# Patient Record
Sex: Female | Born: 1950 | Race: White | Hispanic: No | Marital: Married | Smoking: Former smoker
Health system: Southern US, Community
[De-identification: ages and names within clinical notes are randomized; demographics above are authoritative.]

## PROBLEM LIST (undated history)

## (undated) HISTORY — PX: OTHER SURGICAL HISTORY: SHX169

---

## 1998-03-08 ENCOUNTER — Ambulatory Visit: Admission: RE | Admit: 1998-03-08 | Discharge: 1998-03-08 | Payer: Self-pay | Admitting: Internal Medicine

## 1998-10-07 ENCOUNTER — Ambulatory Visit: Admission: RE | Admit: 1998-10-07 | Discharge: 1998-10-07 | Payer: Self-pay | Admitting: Internal Medicine

## 1999-11-30 ENCOUNTER — Other Ambulatory Visit: Admission: RE | Admit: 1999-11-30 | Discharge: 1999-11-30 | Payer: Self-pay | Admitting: Internal Medicine

## 1999-12-13 ENCOUNTER — Encounter: Payer: Self-pay | Admitting: Internal Medicine

## 1999-12-13 ENCOUNTER — Encounter: Admission: RE | Admit: 1999-12-13 | Discharge: 1999-12-13 | Payer: Self-pay | Admitting: Internal Medicine

## 1999-12-20 ENCOUNTER — Encounter: Admission: RE | Admit: 1999-12-20 | Discharge: 1999-12-20 | Payer: Self-pay | Admitting: Internal Medicine

## 1999-12-20 ENCOUNTER — Encounter: Payer: Self-pay | Admitting: Internal Medicine

## 2000-05-23 ENCOUNTER — Encounter: Admission: RE | Admit: 2000-05-23 | Discharge: 2000-05-23 | Payer: Self-pay | Admitting: Internal Medicine

## 2000-05-23 ENCOUNTER — Encounter: Payer: Self-pay | Admitting: Internal Medicine

## 2001-02-05 ENCOUNTER — Encounter: Admission: RE | Admit: 2001-02-05 | Discharge: 2001-02-05 | Payer: Self-pay | Admitting: Internal Medicine

## 2001-02-05 ENCOUNTER — Encounter: Payer: Self-pay | Admitting: Internal Medicine

## 2001-03-07 ENCOUNTER — Other Ambulatory Visit: Admission: RE | Admit: 2001-03-07 | Discharge: 2001-03-07 | Payer: Self-pay | Admitting: Internal Medicine

## 2001-03-17 ENCOUNTER — Encounter: Admission: RE | Admit: 2001-03-17 | Discharge: 2001-03-17 | Payer: Self-pay | Admitting: Internal Medicine

## 2001-03-17 ENCOUNTER — Encounter: Payer: Self-pay | Admitting: Internal Medicine

## 2002-02-10 ENCOUNTER — Encounter: Admission: RE | Admit: 2002-02-10 | Discharge: 2002-02-10 | Payer: Self-pay | Admitting: Internal Medicine

## 2002-02-10 ENCOUNTER — Encounter: Payer: Self-pay | Admitting: Internal Medicine

## 2002-02-17 ENCOUNTER — Encounter: Payer: Self-pay | Admitting: Internal Medicine

## 2002-02-17 ENCOUNTER — Encounter: Admission: RE | Admit: 2002-02-17 | Discharge: 2002-02-17 | Payer: Self-pay | Admitting: Internal Medicine

## 2002-07-30 ENCOUNTER — Other Ambulatory Visit: Admission: RE | Admit: 2002-07-30 | Discharge: 2002-07-30 | Payer: Self-pay | Admitting: Internal Medicine

## 2003-07-23 ENCOUNTER — Encounter: Admission: RE | Admit: 2003-07-23 | Discharge: 2003-07-23 | Payer: Self-pay | Admitting: Internal Medicine

## 2003-07-23 ENCOUNTER — Encounter: Payer: Self-pay | Admitting: Internal Medicine

## 2003-08-19 ENCOUNTER — Other Ambulatory Visit: Admission: RE | Admit: 2003-08-19 | Discharge: 2003-08-19 | Payer: Self-pay | Admitting: Internal Medicine

## 2003-09-09 ENCOUNTER — Encounter: Admission: RE | Admit: 2003-09-09 | Discharge: 2003-10-08 | Payer: Self-pay | Admitting: Sports Medicine

## 2003-09-10 ENCOUNTER — Encounter: Payer: Self-pay | Admitting: Sports Medicine

## 2003-09-10 ENCOUNTER — Encounter: Admission: RE | Admit: 2003-09-10 | Discharge: 2003-09-10 | Payer: Self-pay | Admitting: Sports Medicine

## 2004-11-23 ENCOUNTER — Encounter: Admission: RE | Admit: 2004-11-23 | Discharge: 2004-11-23 | Payer: Self-pay | Admitting: Internal Medicine

## 2005-09-20 ENCOUNTER — Other Ambulatory Visit: Admission: RE | Admit: 2005-09-20 | Discharge: 2005-09-20 | Payer: Self-pay | Admitting: Internal Medicine

## 2005-12-28 ENCOUNTER — Encounter: Admission: RE | Admit: 2005-12-28 | Discharge: 2005-12-28 | Payer: Self-pay | Admitting: Internal Medicine

## 2007-09-10 ENCOUNTER — Encounter: Admission: RE | Admit: 2007-09-10 | Discharge: 2007-09-10 | Payer: Self-pay | Admitting: Internal Medicine

## 2007-09-19 ENCOUNTER — Other Ambulatory Visit: Admission: RE | Admit: 2007-09-19 | Discharge: 2007-09-19 | Payer: Self-pay | Admitting: Internal Medicine

## 2008-08-30 ENCOUNTER — Ambulatory Visit: Payer: Self-pay | Admitting: Internal Medicine

## 2008-09-02 ENCOUNTER — Other Ambulatory Visit: Admission: RE | Admit: 2008-09-02 | Discharge: 2008-09-02 | Payer: Self-pay | Admitting: Internal Medicine

## 2008-09-02 ENCOUNTER — Ambulatory Visit: Payer: Self-pay | Admitting: Internal Medicine

## 2008-11-08 ENCOUNTER — Encounter: Admission: RE | Admit: 2008-11-08 | Discharge: 2008-11-08 | Payer: Self-pay | Admitting: Internal Medicine

## 2009-03-30 ENCOUNTER — Emergency Department (HOSPITAL_COMMUNITY): Admission: EM | Admit: 2009-03-30 | Discharge: 2009-03-30 | Payer: Self-pay | Admitting: Emergency Medicine

## 2009-07-08 ENCOUNTER — Ambulatory Visit: Payer: Self-pay | Admitting: Internal Medicine

## 2009-07-18 ENCOUNTER — Ambulatory Visit: Payer: Self-pay | Admitting: Internal Medicine

## 2010-04-20 ENCOUNTER — Ambulatory Visit: Payer: Self-pay | Admitting: Internal Medicine

## 2010-06-14 ENCOUNTER — Ambulatory Visit: Payer: Self-pay | Admitting: Vascular Surgery

## 2010-08-14 ENCOUNTER — Ambulatory Visit: Payer: Self-pay | Admitting: Internal Medicine

## 2010-09-19 ENCOUNTER — Ambulatory Visit: Payer: Self-pay | Admitting: Vascular Surgery

## 2010-09-22 ENCOUNTER — Ambulatory Visit: Payer: Self-pay | Admitting: Vascular Surgery

## 2010-12-16 ENCOUNTER — Encounter: Payer: Self-pay | Admitting: Internal Medicine

## 2011-04-10 NOTE — Assessment & Plan Note (Signed)
OFFICE VISIT   RAECHEL, MARCOS  DOB:  08/18/1951                                       09/19/2010  WUJWJ#:[id number]   The patient presents today for continued followup of her right leg  venous pathology.  I had seen her for  initial evaluation in July 2011.  She was fitted with thigh-high graduated compression garments and has  been extremely compliant with these since her  instruction for use of  these.  Despite this she has continued to have discomfort particularly  when sitting and standing for prolonged periods of time In the posterior  thigh where these varicosities are.  She reports that housework and leg  work are difficult due to pain over these areas as well..  She does sit  for a prolonged period of time at her occupation.  I reimaged her veins  with Sono[employer] for screening and this does show reflux in her great  saphenous vein extending into these veins as well.  I feel that she  would be an excellent candidate for ablation of her saphenous vein and  stab phlebectomy of these tributary varicosities for symptom relief.  We  will schedule this at her convenience.     Larina Earthly, M.D.  Electronically Signed   TFE/MEDQ  D:  09/19/2010  T:  09/20/2010  Job:  [id number]   cc:   Luanna Cole. Lenord Fellers, M.D.

## 2011-04-10 NOTE — Procedures (Signed)
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   EXAM:  Using color-flow imaging and pulse Doppler spectral analysis, the  right common femoral, superficial femoral, popliteal, posterior tibial,  greater and lesser saphenous veins are evaluated.  There is no evidence  suggesting deep venous insufficiency in the right lower extremity.   The right saphenofemoral junction is competent. The right GSV is  competent.   The right proximal short saphenous vein demonstrates competency.   GSV Diameter (used if found to be incompetent only)                                            Right    Left  Proximal Greater Saphenous Vein           cm       cm  Proximal-to-mid-thigh                     cm       cm  Mid thigh                                 cm       cm  Mid-distal thigh                          cm       cm  Distal thigh                              cm       cm  Knee                                      cm       cm   IMPRESSION:  1. The right greater saphenous vein is competent.  2. The right greater saphenous vein is not aneurysmal.  3. The right greater saphenous vein is not tortuous.  4. The deep venous system is competent.  5. The right lesser saphenous vein is competent.  6. A small area in the mid greater saphenous vein that has a varicose      vein coming off does display reflux in that localized area but not      above or below that area.   ___________________________________________  Larina Earthly, M.D.   NT/MEDQ  D:  06/14/2010  T:  06/14/2010  Job:  [id number]

## 2011-04-10 NOTE — Procedures (Signed)
LOWER EXTREMITY VENOUS REFLUX EXAM   INDICATION:   EXAM:  Using color-flow imaging and pulse Doppler spectral analysis, the  right common femoral, superficial femoral, popliteal, posterior tibial,  greater and lesser saphenous veins were evaluated.  There was no  evidence suggesting deep venous insufficiency in the right lower  extremity.   The right saphenofemoral junction is competent.  The right GSV is not  competent with reflux of >500 milliseconds with the caliber as described  below.   The right proximal short saphenous vein demonstrates competency.   GSV Diameter (used if found to be incompetent only)                                            Right    Left  Proximal Greater Saphenous Vein           56 cm    cm  Proximal-to-mid-thigh                     cm       cm  Mid thigh                                 54 cm    cm  Mid-distal thigh                          cm       cm  Distal thigh                              33 cm    cm  Knee                                      cm       cm   IMPRESSION:  The right greater saphenous vein is not competent with  reflux of >500 milliseconds.  Please note the lateral branch in the  proximal area out of the inguinal canal is incompetent.  The medial  branch is incompetent with a focal area within the mid thigh.  The right  greater saphenous vein is not tortuous.  The deep venous system is  competent.  The right short saphenous vein is competent.  There is  reflux within the right greater saphenous vein at the lateral branch in  proximal thigh out of the inguinal canal.  There is reflux within the  right greater saphenous medial branch mid thigh in a focal area.  The  measurements for the right greater saphenous vein the lateral branch  proximal is 0.43 cm, mid is 0.33 cm and distal is 0.27 cm.    ___________________________________________  Larina Earthly, M.D.   OD/MEDQ  D:  09/25/2010  T:  09/25/2010  Job:   [id number]

## 2011-04-10 NOTE — Consult Note (Signed)
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  These  have been present for many years and she did suffer clot in these a  number of years ago.  At that time, she was treated with heparin and bed  rest.  These do cause discomfort with her with prolonged standing and  sitting.  She elevates her legs when possible.  She does not have any  significant swelling in her ankle and does not have any left leg  varicosities.   PAST MEDICAL HISTORY:  Unremarkable.  She is on no medications and has  no major medical difficulties such as hypertension or cardiac disease.   FAMILY HISTORY:  Negative for premature atherosclerotic disease.   SOCIAL HISTORY:  She is married with 2 children.  She works as a Radio producer.  She does not smoke having quit 20 years ago.  She does not  drink alcohol on a regular basis.   REVIEW OF SYSTEMS:  Negative for weight loss or weight gain.  Her weight  is 125 pounds.  She is 5 feet 7 inches tall.  The remainder of her  review is negative as documented in her chart and encounter form.   PHYSICAL EXAM:  Well-developed, thin, white female in no acute distress.  Blood pressure 117/59, pulse 63, respirations 16.  She is in no acute  distress.  HEENT:  Normal.  Her dorsalis pedis pulses are 2+ bilaterally.  Her radial pulses are 2+  bilaterally.  MUSCULOSKELETAL:  No major deformities or cyanosis.  NEUROLOGIC:  No focal weakness or paresthesia.  SKIN:  Without ulcers or rashes.  She does have a large plexus of varicosities in her posterior medial  right thigh.   She underwent noninvasive vascular evaluation in our office and this  showed reflux in her  great saphenous vein at the level of the  varicosities.  This does have a large branch that feeds into and then  exits out of this large nest of varicosities.  She does not have any  evidence of deep venous reflux and does not have any small saphenous  vein reflux.  I discussed options with the patient.  I explained that  this should not preclude her at any risk for more significant problems  such as DVT.  She is having discomfort associated with this.  She is  fitted for graduated compression garments, thigh-high, 20-30 mmHg today  and she is instructed on her daily use of these.  We will see her again  in 3 months for further discussion.  I do feel that she is a good  candidate for laser ablation and stab phlebectomy for relief of her  symptoms should she fail conservative treatment.     Larina Earthly, M.D.  Electronically Signed   TFE/MEDQ  D:  06/14/2010  T:  06/15/2010  Job:  [id number]   cc:   Luanna Cole. Lenord Fellers, M.D.

## 2011-09-21 ENCOUNTER — Encounter: Payer: Self-pay | Admitting: Internal Medicine

## 2011-09-21 ENCOUNTER — Ambulatory Visit (INDEPENDENT_AMBULATORY_CARE_PROVIDER_SITE_OTHER): Payer: 59 | Admitting: Internal Medicine

## 2011-09-21 VITALS — BP 96/58 | HR 76 | Temp 99.0°F | Ht 66.0 in | Wt 124.0 lb

## 2011-09-21 DIAGNOSIS — J069 Acute upper respiratory infection, unspecified: Secondary | ICD-10-CM

## 2011-09-22 ENCOUNTER — Encounter: Payer: Self-pay | Admitting: Internal Medicine

## 2011-09-22 DIAGNOSIS — M8580 Other specified disorders of bone density and structure, unspecified site: Secondary | ICD-10-CM | POA: Insufficient documentation

## 2011-09-22 NOTE — Patient Instructions (Signed)
Take Zithromax Z-PAK as directed. If not better in one week have prescription refill. Use Hycodan as needed for cough

## 2011-09-22 NOTE — Progress Notes (Signed)
  Subjective:    Patient ID: Regina Merritt, female    DOB: 09/30/1951, 60 y.o.   MRN: [id number]  HPI 60 year old white female last seen in fall 2011 in today with URI symptoms. Had shingles vaccine may 2011; influenza immunization through her employment, tetanus vaccine through employment 2003. History of bilateral tubal ligation 1989, herpes zoster right buttock  August 2010, history of osteopenia last bone density study 2008, remote history of migraine headache better since menopause, remote history of depression 1998.  No sore throat or headache. No fever or chills. Sinus congestion.    Review of Systems     Objective:   Physical Exam appears to be fatigued; pharynx no exudate; TMs clear; boggy nasal mucosa; neck is supple without significant adenopathy; chest is clear        Assessment & Plan:  URI  Plan Zithromax Z-Pak take 2 tablets by mouth day one followed by 1 tablet by mouth days 2 through 5 with 1 refill. Not better in one week have prescription refill. Hycodan 8 ounces 1 teaspoon by mouth every 6 hours when necessary cough

## 2012-03-31 ENCOUNTER — Other Ambulatory Visit: Payer: Self-pay | Admitting: Internal Medicine

## 2012-03-31 ENCOUNTER — Ambulatory Visit (INDEPENDENT_AMBULATORY_CARE_PROVIDER_SITE_OTHER): Payer: 59 | Admitting: Internal Medicine

## 2012-03-31 ENCOUNTER — Encounter: Payer: Self-pay | Admitting: Internal Medicine

## 2012-03-31 VITALS — BP 100/64 | HR 68 | Temp 98.8°F | Wt 117.0 lb

## 2012-03-31 DIAGNOSIS — M19049 Primary osteoarthritis, unspecified hand: Secondary | ICD-10-CM

## 2012-03-31 DIAGNOSIS — G5603 Carpal tunnel syndrome, bilateral upper limbs: Secondary | ICD-10-CM

## 2012-03-31 DIAGNOSIS — M1990 Unspecified osteoarthritis, unspecified site: Secondary | ICD-10-CM

## 2012-03-31 DIAGNOSIS — R202 Paresthesia of skin: Secondary | ICD-10-CM

## 2012-03-31 DIAGNOSIS — M129 Arthropathy, unspecified: Secondary | ICD-10-CM

## 2012-03-31 DIAGNOSIS — R209 Unspecified disturbances of skin sensation: Secondary | ICD-10-CM

## 2012-03-31 DIAGNOSIS — G5600 Carpal tunnel syndrome, unspecified upper limb: Secondary | ICD-10-CM

## 2012-04-01 ENCOUNTER — Encounter: Payer: Self-pay | Admitting: Internal Medicine

## 2012-04-01 NOTE — Patient Instructions (Addendum)
We are drawing blood test for hypothyroidism and rheumatoid arthritis. We will make appointment for you to see hand surgeon.

## 2012-04-01 NOTE — Progress Notes (Signed)
  Subjective:    Patient ID: Regina Merritt, female    DOB: 1951-04-15, 61 y.o.   MRN: [id number]  HPI 61 year old white female [employer] employee with numbness and weakness in both hands for several months. Symptoms are vague. Says at times has paresthesias in all 5 fingers of both hands. Says crypts are weak bilaterally. Has some pain in the palmar aspect of hands. Not dropping objects.    Review of Systems     Objective:   Physical Exam grip 4/5 bilaterally. Deep tendon reflexes 1+ and symmetrical upper extremities. Heberden's and Bouchard's nodes present bilaterally consistent with osteoarthritis of the hands        Assessment & Plan:  Possible carpal tunnel syndrome (bilateral)  Osteoarthritis of  hands  Plan: Check TSH, CCP. Refer to hand surgeon for further evaluation.

## 2012-04-02 LAB — CYCLIC CITRUL PEPTIDE ANTIBODY, IGG
Cyclic Citrullin Peptide Ab: <2 U/mL (ref 0.0–5.0)
Cyclic Citrullin Peptide Ab: <2 U/mL (ref 0.0–5.0)

## 2012-04-07 LAB — OTHER SOLSTAS TEST

## 2012-08-08 ENCOUNTER — Encounter: Payer: Self-pay | Admitting: Internal Medicine

## 2012-08-08 ENCOUNTER — Ambulatory Visit (INDEPENDENT_AMBULATORY_CARE_PROVIDER_SITE_OTHER): Payer: 59 | Admitting: Internal Medicine

## 2012-08-08 VITALS — BP 130/76 | HR 64 | Temp 98.7°F | Wt 121.5 lb

## 2012-08-08 DIAGNOSIS — G4700 Insomnia, unspecified: Secondary | ICD-10-CM

## 2012-08-08 DIAGNOSIS — F430 Acute stress reaction: Secondary | ICD-10-CM

## 2012-08-08 DIAGNOSIS — F439 Reaction to severe stress, unspecified: Secondary | ICD-10-CM

## 2012-08-24 ENCOUNTER — Encounter: Payer: Self-pay | Admitting: Internal Medicine

## 2012-08-24 DIAGNOSIS — G4700 Insomnia, unspecified: Secondary | ICD-10-CM | POA: Insufficient documentation

## 2012-08-24 NOTE — Patient Instructions (Addendum)
Take antianxiety medication one hour before going to sleep return as needed

## 2012-08-24 NOTE — Progress Notes (Signed)
  Subjective:    Patient ID: Regina Merritt, female    DOB: 09-22-1951, 61 y.o.   MRN: [id number]  HPI 61 year old white female with issues with insomnia recently. A lot of issues going on with work. Family issues with elderly mother. Not sleeping through the night. Denies being depressed. Has a lot on her mind. Needs to feel better rested. Remote history of migraine headaches but these do not seem to be an issue status post menopause.    Review of Systems     Objective:   Physical Exam skin warm and dry; no thyromegaly; chest clear to auscultation; cardiac exam regular rate and rhythm normal S1 and S2, extremities without edema         Assessment & Plan:  Insomnia  Situational stress  Plan: Xanax 0.5 mg at bedtime when necessary sleep

## 2012-10-02 ENCOUNTER — Encounter: Payer: Self-pay | Admitting: Internal Medicine

## 2012-10-02 ENCOUNTER — Ambulatory Visit (INDEPENDENT_AMBULATORY_CARE_PROVIDER_SITE_OTHER): Payer: 59 | Admitting: Internal Medicine

## 2012-10-02 ENCOUNTER — Other Ambulatory Visit: Payer: Self-pay | Admitting: Internal Medicine

## 2012-10-02 VITALS — BP 126/76 | HR 68 | Temp 98.0°F | Wt 117.5 lb

## 2012-10-02 DIAGNOSIS — M79641 Pain in right hand: Secondary | ICD-10-CM

## 2012-10-02 DIAGNOSIS — M19049 Primary osteoarthritis, unspecified hand: Secondary | ICD-10-CM

## 2012-10-02 DIAGNOSIS — M2550 Pain in unspecified joint: Secondary | ICD-10-CM

## 2012-10-02 DIAGNOSIS — M79609 Pain in unspecified limb: Secondary | ICD-10-CM

## 2012-10-02 LAB — URIC ACID: Uric Acid, Serum: 5.1 mg/dL (ref 2.4–7.0)

## 2012-10-02 LAB — SEDIMENTATION RATE: Sed Rate: 1 mm/hr (ref 0–22)

## 2012-10-02 NOTE — Progress Notes (Signed)
  Subjective:    Patient ID: Regina Merritt, female    DOB: 07-05-1951, 61 y.o.   MRN: [id number]  HPI Was seen in May for bilateral hand pain and numbness. TSH and CCP were within normal limits. [facility] Dr. Teressa Senter evaluated her. She had electrodiagnostic studies with no evidence of median or ulnar nerve abnormalities she had complained of morning stiffness. He was concerned she could have an inflammatory arthropathy. She did not have x-rays of the hands at that time. She also complained of some decreased strength in her hands when opening jars. She also had some numbness in her hands at night. Still complaining of morning stiffness.    Review of Systems     Objective:   Physical Exam Heberden's and Bouchard's nodes are present bilaterally. No erythema of finger joints bilaterally. Wrist joints slightly tender bilaterally. Good range of motion with these joints.        Assessment & Plan:  Despite negative CCP, still concerned about possible rheumatoid arthritis. Nerve conduction studies were negative in May 2013. Will refer patient to rheumatologist. ANA has been drawn   today. TSH, CCP were normal in May.  Diagnosis: Hand arthropathy bilateral

## 2012-10-03 LAB — ANTI-NUCLEAR AB-TITER (ANA TITER): ANA Titer 1: 1:1280 {titer} — ABNORMAL HIGH

## 2012-10-03 LAB — ANA: Anti Nuclear Antibody(ANA): POSITIVE — AB

## 2012-10-07 LAB — ANTI-SMITH ANTIBODY: ENA SM Ab Ser-aCnc: 3 AU/mL (ref <30)

## 2012-10-09 ENCOUNTER — Telehealth: Payer: Self-pay

## 2012-10-09 NOTE — Telephone Encounter (Signed)
Patient scheduled for an appointment with Dr. Ewell Poe on 10/15/2012 at 1:00 pm. She has been notified of this appointment

## 2012-10-16 DIAGNOSIS — M79641 Pain in right hand: Secondary | ICD-10-CM | POA: Insufficient documentation

## 2012-10-16 NOTE — Patient Instructions (Addendum)
We are checking some additional rheumatology studies including ANA. Will refer to rheumatologist.

## 2013-01-16 ENCOUNTER — Encounter: Payer: Self-pay | Admitting: Internal Medicine

## 2013-01-16 ENCOUNTER — Ambulatory Visit (INDEPENDENT_AMBULATORY_CARE_PROVIDER_SITE_OTHER): Payer: 59 | Admitting: Internal Medicine

## 2013-01-16 VITALS — BP 96/66 | Temp 99.9°F | Wt 119.0 lb

## 2013-01-16 DIAGNOSIS — R509 Fever, unspecified: Secondary | ICD-10-CM

## 2013-01-16 DIAGNOSIS — J029 Acute pharyngitis, unspecified: Secondary | ICD-10-CM

## 2013-01-16 LAB — CBC WITH DIFFERENTIAL/PLATELET
Basophils Relative: 0 % (ref 0–1)
Eosinophils Absolute: 0 10*3/uL (ref 0.0–0.7)
HCT: 37 % (ref 36.0–46.0)
Hemoglobin: 12.7 g/dL (ref 12.0–15.0)
Lymphocytes Relative: 7 % — ABNORMAL LOW (ref 12–46)
Lymphs Abs: 0.6 10*3/uL — ABNORMAL LOW (ref 0.7–4.0)
MCH: 29.5 pg (ref 26.0–34.0)
MCHC: 34.3 g/dL (ref 30.0–36.0)
MCV: 85.8 fL (ref 78.0–100.0)
Monocytes Absolute: 1 10*3/uL (ref 0.1–1.0)
Monocytes Relative: 11 % (ref 3–12)
Neutro Abs: 7.2 10*3/uL (ref 1.7–7.7)
Neutrophils Relative %: 82 % — ABNORMAL HIGH (ref 43–77)
RBC: 4.31 MIL/uL (ref 3.87–5.11)
RDW: 14.7 % (ref 11.5–15.5)

## 2013-01-16 LAB — POCT RAPID STREP A (OFFICE): Rapid Strep A Screen: NEGATIVE

## 2013-01-16 NOTE — Patient Instructions (Addendum)
Take Tylenol as needed for fever and myalgias. Start Tamiflu 75 mg twice daily for 5 days. CBC and nasal swab for influenza pending

## 2013-01-16 NOTE — Progress Notes (Signed)
  Subjective:    Patient ID: DEYANNA MCTIER, female    DOB: [birth year], 62 y.o.   MRN: [id number]  HPI 62 year old white female in today with multiple symptoms including nausea. She vomited once. Had one episode of diarrhea. Had onset last night of runny nose, chills, myalgias and malaise. Feels weak. Has had photophobia. Sore throat. No ear pain. No significant cough. Onset was abrupt last evening. No discolored nasal drainage or sputum production. No urinary tract infection symptoms.    Review of Systems     Objective:   Physical Exam HEENT exam: Pharynx is red. Rapid strep screen is negative. TMs are dull but not red. Anterior cervical nodes bilaterally. Neck is supple. Chest is clear.        Assessment & Plan:  Viral syndrome-possible influenza  Plan: Patient declined offer for nausea medication. Try Tamiflu 75 mg twice daily for 5 days. Nasal influenza swab taken. CBC with differential drawn. Tylenol as needed for fever and myalgias.

## 2013-01-17 LAB — INFLUENZA A AND B
Inflenza A Ag: NEGATIVE
Influenza B Ag: NEGATIVE

## 2013-09-08 ENCOUNTER — Ambulatory Visit (INDEPENDENT_AMBULATORY_CARE_PROVIDER_SITE_OTHER): Payer: 59 | Admitting: Internal Medicine

## 2013-09-08 ENCOUNTER — Encounter: Payer: Self-pay | Admitting: Internal Medicine

## 2013-09-08 VITALS — BP 124/68 | Temp 100.7°F | Wt 117.0 lb

## 2013-09-08 DIAGNOSIS — J209 Acute bronchitis, unspecified: Secondary | ICD-10-CM

## 2013-09-08 DIAGNOSIS — J029 Acute pharyngitis, unspecified: Secondary | ICD-10-CM

## 2013-09-08 DIAGNOSIS — J040 Acute laryngitis: Secondary | ICD-10-CM

## 2013-09-08 LAB — POCT RAPID STREP A (OFFICE): Rapid Strep A Screen: NEGATIVE

## 2013-09-08 MED ORDER — LEVOFLOXACIN 500 MG PO TABS: 500.0000 mg | ORAL_TABLET | Freq: Every day | ORAL | Status: DC

## 2013-09-08 MED ORDER — HYDROCODONE-HOMATROPINE 5-1.5 MG/5ML PO SYRP: 5.0000 mL | ORAL_SOLUTION | Freq: Three times a day (TID) | ORAL | Status: DC | PRN

## 2013-09-08 NOTE — Progress Notes (Signed)
  Subjective:    Patient ID: Regina Merritt, female    DOB: 1951-06-08, 62 y.o.   MRN: [id number]  HPI Patient recently was on 12 day course of prednisone for arthritis. She came down with respiratory infection on Friday, October 10. Has had laryngitis, productive cough, no fever or shaking chills. Has had right rib cage pain. Pulse oximetry is 96% on room air. Has felt some shortness of breath but no wheezing. Is worried about pneumonia. She is hoarse.    Review of Systems     Objective:   Physical Exam  Pharynx is slightly injected. Rapid strep screen negative. TMs are clear. Neck is supple without adenopathy. Chest clear without rales or wheezing. Tender right lower anterior chest wall to palpation.        Assessment & Plan:  Laryngitis  Bronchitis  Chest wall pain  Plan: Levaquin 500 milligrams daily for 7 days with one refill. Not better in 7 days have prescription refill. Hycodan 1 teaspoon by mouth Q8 hours when necessary cough with no refill.

## 2013-09-08 NOTE — Patient Instructions (Signed)
Take levaquin x 7 days. Take Hycodan sparingly for cough.

## 2014-04-26 ENCOUNTER — Encounter: Payer: Self-pay | Admitting: Internal Medicine

## 2014-04-26 ENCOUNTER — Ambulatory Visit (INDEPENDENT_AMBULATORY_CARE_PROVIDER_SITE_OTHER): Payer: 59 | Admitting: Internal Medicine

## 2014-04-26 VITALS — BP 116/64 | HR 72 | Temp 99.1°F | Wt 119.5 lb

## 2014-04-26 DIAGNOSIS — L02419 Cutaneous abscess of limb, unspecified: Secondary | ICD-10-CM

## 2014-04-26 DIAGNOSIS — L03119 Cellulitis of unspecified part of limb: Secondary | ICD-10-CM

## 2014-04-26 DIAGNOSIS — W57XXXA Bitten or stung by nonvenomous insect and other nonvenomous arthropods, initial encounter: Secondary | ICD-10-CM

## 2014-04-26 DIAGNOSIS — L03116 Cellulitis of left lower limb: Secondary | ICD-10-CM

## 2014-04-26 DIAGNOSIS — T148 Other injury of unspecified body region: Secondary | ICD-10-CM

## 2014-04-26 MED ORDER — LEVOFLOXACIN 500 MG PO TABS: 500.0000 mg | ORAL_TABLET | Freq: Every day | ORAL | Status: DC

## 2014-04-26 MED ORDER — TRIAMCINOLONE ACETONIDE 0.1 % EX CREA: 1.0000 "application " | TOPICAL_CREAM | Freq: Three times a day (TID) | CUTANEOUS | Status: DC

## 2014-04-26 MED ORDER — HYDROXYZINE HCL 25 MG PO TABS: 25.0000 mg | ORAL_TABLET | Freq: Three times a day (TID) | ORAL | Status: DC | PRN

## 2014-04-26 NOTE — Patient Instructions (Signed)
Take Atarax  as needed for itching.  Take antibiotic as directed for cellulitis. Use Triamcinolone cream 3 times a day for itching.

## 2014-04-26 NOTE — Progress Notes (Signed)
   Subjective:    Patient ID: Regina Merritt, female    DOB: 09/26/1951, 63 y.o.   MRN: [id number]  HPI Has been keeping the dog for her son and has noticed that dog has some takes occasionally. Patient noticed 3 insect bites left hip about a week ago. One has become very red and puffy along left hip. The other 2 seemed to be in the process of healing. These are very itchy. She noticed this upon awakening. Did not see any tick or spider in the bed.    Review of Systems     Objective:   Physical Exam 5 cm area of erythema with centralized insect bite. This area is warm to touch and rib. 2 other insect bites below this one on left hip without is much erythema and no puffiness or increased warmth.       Assessment & Plan:  Cellulitis due to insect bite left hip  2 other insect bites without cellulitis  Plan: Levaquin 500 milligrams daily for 7 days. Triamcinolone cream 0.1% to use on insect bites 3 times a day. Atarax 25 mg (#60) 1 or 2 by mouth 3 times a day when necessary itching. Call if not better in 7-10 days.

## 2014-07-29 ENCOUNTER — Encounter: Payer: Self-pay | Admitting: Internal Medicine

## 2014-07-29 ENCOUNTER — Other Ambulatory Visit: Payer: Self-pay | Admitting: Internal Medicine

## 2014-07-29 ENCOUNTER — Ambulatory Visit
Admission: RE | Admit: 2014-07-29 | Discharge: 2014-07-29 | Disposition: A | Discharge status: Home / Self Care | Payer: 59 | Source: Ambulatory Visit | Attending: Internal Medicine | Admitting: Internal Medicine

## 2014-07-29 ENCOUNTER — Ambulatory Visit (INDEPENDENT_AMBULATORY_CARE_PROVIDER_SITE_OTHER): Payer: 59 | Admitting: Internal Medicine

## 2014-07-29 VITALS — BP 98/60 | HR 66 | Ht 67.0 in | Wt 119.0 lb

## 2014-07-29 DIAGNOSIS — Z0000 Encounter for general adult medical examination without abnormal findings: Secondary | ICD-10-CM

## 2014-07-29 DIAGNOSIS — R0781 Pleurodynia: Secondary | ICD-10-CM

## 2014-07-29 DIAGNOSIS — Z780 Asymptomatic menopausal state: Secondary | ICD-10-CM

## 2014-07-29 DIAGNOSIS — R079 Chest pain, unspecified: Secondary | ICD-10-CM

## 2014-07-29 NOTE — Patient Instructions (Signed)
Please get rib detail films today. Have mammogram and bone density study in the near future. We will book physical exam in the near future.

## 2014-07-29 NOTE — Progress Notes (Signed)
   Subjective:    Patient ID: Regina Merritt, female    DOB: 01/29/1951, 63 y.o.   MRN: [id number]  HPI  Patient has been keeping a 40 pound dog for her son. She had the dog out for walk the other night on a leash and unfortunately she fell down as the dog was pulling her with  leash. This was about a week ago. She struck her right rib cage area. Was not able to breathe well for several days. Beginning to feel better now with less pain with breathing.    Review of Systems     Objective:   Physical Exam  She has right rib tenderness and axillary line on the right. Seems to be a superior rehabilitation that is tender. Chest is clear to auscultation. No friction rub.      Assessment & Plan:  Right rib injury rule out fracture  Addendum: Rib films are negative for fracture  Plan: Patient declines offer for pain medication. Will receive influenza vaccine through employment. Return as needed. Have bone density study and mammogram in the near future.

## 2014-07-29 NOTE — Progress Notes (Signed)
Patient informed. 

## 2014-07-30 NOTE — Addendum Note (Signed)
Addended by: Amado Coe on: 07/30/2014 12:01 PM   Modules accepted: Orders

## 2014-09-02 ENCOUNTER — Ambulatory Visit
Admission: RE | Admit: 2014-09-02 | Discharge: 2014-09-02 | Disposition: A | Discharge status: Home / Self Care | Payer: 59 | Source: Ambulatory Visit | Attending: Internal Medicine | Admitting: Internal Medicine

## 2014-09-02 ENCOUNTER — Other Ambulatory Visit: Payer: 59

## 2014-09-02 ENCOUNTER — Telehealth: Payer: Self-pay

## 2014-09-02 DIAGNOSIS — Z0000 Encounter for general adult medical examination without abnormal findings: Secondary | ICD-10-CM

## 2014-09-02 DIAGNOSIS — Z780 Asymptomatic menopausal state: Secondary | ICD-10-CM

## 2014-09-02 NOTE — Telephone Encounter (Signed)
Appointment scheduled to discuss bone density results.

## 2014-09-02 NOTE — Telephone Encounter (Signed)
Message copied by Amado Coe on Thu Sep 02, 2014  4:48 PM ------      Message from: Elby Showers      Created: Thu Sep 02, 2014  3:59 PM       OV to discuss bone density results ------

## 2014-09-10 ENCOUNTER — Other Ambulatory Visit: Payer: Self-pay

## 2014-09-13 ENCOUNTER — Other Ambulatory Visit (HOSPITAL_COMMUNITY)
Admission: RE | Admit: 2014-09-13 | Discharge: 2014-09-13 | Disposition: A | Discharge status: Home / Self Care | Payer: 59 | Source: Ambulatory Visit | Attending: Internal Medicine | Admitting: Internal Medicine

## 2014-09-13 ENCOUNTER — Ambulatory Visit (INDEPENDENT_AMBULATORY_CARE_PROVIDER_SITE_OTHER): Payer: 59 | Admitting: Internal Medicine

## 2014-09-13 ENCOUNTER — Encounter: Payer: Self-pay | Admitting: Internal Medicine

## 2014-09-13 VITALS — BP 110/70 | HR 62 | Temp 97.2°F | Ht 67.0 in | Wt 120.0 lb

## 2014-09-13 DIAGNOSIS — Z23 Encounter for immunization: Secondary | ICD-10-CM | POA: Diagnosis not present

## 2014-09-13 DIAGNOSIS — M069 Rheumatoid arthritis, unspecified: Secondary | ICD-10-CM | POA: Diagnosis not present

## 2014-09-13 DIAGNOSIS — M8580 Other specified disorders of bone density and structure, unspecified site: Secondary | ICD-10-CM | POA: Diagnosis not present

## 2014-09-13 DIAGNOSIS — Z01419 Encounter for gynecological examination (general) (routine) without abnormal findings: Secondary | ICD-10-CM | POA: Diagnosis present

## 2014-09-13 DIAGNOSIS — D72819 Decreased white blood cell count, unspecified: Secondary | ICD-10-CM

## 2014-09-13 DIAGNOSIS — Z0000 Encounter for general adult medical examination without abnormal findings: Secondary | ICD-10-CM

## 2014-09-13 DIAGNOSIS — E559 Vitamin D deficiency, unspecified: Secondary | ICD-10-CM

## 2014-09-13 LAB — TSH: TSH: 1.153 u[IU]/mL (ref 0.350–4.500)

## 2014-09-13 LAB — CBC WITH DIFFERENTIAL/PLATELET
BASOS PCT: 0 % (ref 0–1)
Basophils Absolute: 0 10*3/uL (ref 0.0–0.1)
Eosinophils Absolute: 0.1 10*3/uL (ref 0.0–0.7)
Eosinophils Relative: 2 % (ref 0–5)
HEMATOCRIT: 36 % (ref 36.0–46.0)
HEMOGLOBIN: 12.2 g/dL (ref 12.0–15.0)
LYMPHS ABS: 0.8 10*3/uL (ref 0.7–4.0)
Lymphocytes Relative: 22 % (ref 12–46)
MCH: 30.3 pg (ref 26.0–34.0)
MCHC: 33.9 g/dL (ref 30.0–36.0)
MCV: 89.3 fL (ref 78.0–100.0)
MONO ABS: 0.4 10*3/uL (ref 0.1–1.0)
MONOS PCT: 11 % (ref 3–12)
NEUTROS PCT: 65 % (ref 43–77)
Neutro Abs: 2.3 10*3/uL (ref 1.7–7.7)
Platelets: 215 10*3/uL (ref 150–400)
RBC: 4.03 MIL/uL (ref 3.87–5.11)
RDW: 14.1 % (ref 11.5–15.5)
WBC: 3.5 10*3/uL — ABNORMAL LOW (ref 4.0–10.5)

## 2014-09-13 LAB — COMPREHENSIVE METABOLIC PANEL
ALT: 14 U/L (ref 0–35)
AST: 19 U/L (ref 0–37)
Albumin: 4.1 g/dL (ref 3.5–5.2)
Alkaline Phosphatase: 50 U/L (ref 39–117)
BILIRUBIN TOTAL: 0.6 mg/dL (ref 0.2–1.2)
BUN: 19 mg/dL (ref 6–23)
CO2: 26 meq/L (ref 19–32)
Calcium: 9 mg/dL (ref 8.4–10.5)
Chloride: 106 mEq/L (ref 96–112)
Creat: 0.97 mg/dL (ref 0.50–1.10)
GLUCOSE: 88 mg/dL (ref 70–99)
Potassium: 4 mEq/L (ref 3.5–5.3)
SODIUM: 141 meq/L (ref 135–145)
TOTAL PROTEIN: 6.3 g/dL (ref 6.0–8.3)

## 2014-09-13 LAB — LIPID PANEL
CHOLESTEROL: 158 mg/dL (ref 0–200)
HDL: 65 mg/dL (ref >39)
LDL Cholesterol: 84 mg/dL (ref 0–99)
TRIGLYCERIDES: 46 mg/dL (ref <150)
Total CHOL/HDL Ratio: 2.4 Ratio
VLDL: 9 mg/dL (ref 0–40)

## 2014-09-13 MED ORDER — ALENDRONATE SODIUM 70 MG PO TABS: 70.0000 mg | ORAL_TABLET | ORAL | Status: DC

## 2014-09-13 NOTE — Patient Instructions (Addendum)
Start Fosamx 70 mg weekly. RTC in one year. Take 2000 units vitamin D 3 daily.

## 2014-09-14 LAB — VITAMIN D 25 HYDROXY (VIT D DEFICIENCY, FRACTURES): Vit D, 25-Hydroxy: 24 ng/mL — ABNORMAL LOW (ref 30–89)

## 2014-09-15 LAB — CYTOLOGY - PAP

## 2014-11-28 NOTE — Progress Notes (Signed)
   Subjective:    Patient ID: Regina Merritt, female    DOB: [birth year], 64 y.o.   MRN: [id number]  HPI  64 year old White Female in today for health maintenance exam. Remote history of migraine headaches. Doesn't really get these anymore since she became menopausal. History of hand arthritis diagnosed by rheumatologist, Dr. Ouida Sills, in 2013. She is on methotrexate. She had a positive ANA with a speckled pattern and a negative CCP.  Past medical history: She is allergic to penicillin it causes hives. Intolerant of Keflex as well. History of osteopenia. Came menopausal around 2005. 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 Had depression in 1998. 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 Had bilateral tubal ligation 1989. Zostavax vaccine given in 2011. Took Actonel for a while for osteopenia. Recent bone density study earlier this month showed right femoral neck -2.2 and LS-spine -1.4. In 2005 she had a bone density study showing a T score of -1.8 in the femoral neck and -0.5 in the LS-spine. Saw Dr. early in 2011 for varicosities in right leg. Was treated with compression stockings and was thought to be a good candidate for laser ablation and stab phlebectomy. History of onychomycosis treated with Sporanox 1999.  Social history: She's been married twice. Has 2 children. Nonsmoker. No alcohol consumption. Employed as a Environmental education officer at [employer].  Family history:  One brother alive and well. Father with history of hypertension.    Review of Systems  Constitutional: Negative.   All other systems reviewed and are negative.      Objective:   Physical Exam  Constitutional: She is oriented to person, place, and time. She appears well-developed and well-nourished. No distress.  HENT:  Head: Normocephalic and atraumatic.  Right Ear: External ear normal.  Left Ear: External ear normal.  Mouth/Throat: Oropharynx is clear and moist. No oropharyngeal exudate.  Eyes: Conjunctivae are  normal. Pupils are equal, round, and reactive to light. Right eye exhibits no discharge. Left eye exhibits no discharge. No scleral icterus.  Neck: Neck supple. No JVD present. No thyromegaly present.  Cardiovascular: Normal rate, regular rhythm, normal heart sounds and intact distal pulses.   No murmur heard. Pulmonary/Chest: Effort normal and breath sounds normal. No respiratory distress. She has no wheezes. She has no rales.  Breasts normal female  Abdominal: Soft. Bowel sounds are normal. She exhibits no distension and no mass. There is no tenderness. There is no rebound and no guarding.  Genitourinary:  Pap taken  Musculoskeletal: She exhibits no edema.  Lymphadenopathy:    She has no cervical adenopathy.  Neurological: She is alert and oriented to person, place, and time. No cranial nerve deficit. Coordination normal.  Skin: Skin is warm and dry. No rash noted. She is not diaphoretic.  Psychiatric: She has a normal mood and affect. Her behavior is normal. Judgment and thought content normal.  Vitals reviewed.         Assessment & Plan:   Normal health maintenance exam  Vitamin D deficiency   Hand arthritis treated with methotrexate by rheumatologist  Osteopenia-begin Fosamax weekly.  Plan: 2000 units vitamin D 3 daily. Return in one year. White blood cell count is slightly low at 3500. She is on methotrexate and is monitored by rheumatologist.  Needs annual mammogram. Has declined colonoscopy in the past.

## 2014-11-29 ENCOUNTER — Telehealth: Payer: Self-pay | Admitting: *Deleted

## 2014-11-29 NOTE — Telephone Encounter (Signed)
Called Dr Sharyon Cable office requested last office visit note be faxed over for review by Dr Renold Genta spoke with Lattie Haw in Medical records she will fax over last note

## 2015-01-14 ENCOUNTER — Encounter: Payer: Self-pay | Admitting: Internal Medicine

## 2015-01-14 ENCOUNTER — Ambulatory Visit (INDEPENDENT_AMBULATORY_CARE_PROVIDER_SITE_OTHER): Payer: 59 | Admitting: Internal Medicine

## 2015-01-14 VITALS — BP 104/64 | HR 104 | Temp 99.3°F | Wt 123.0 lb

## 2015-01-14 DIAGNOSIS — J029 Acute pharyngitis, unspecified: Secondary | ICD-10-CM

## 2015-01-14 DIAGNOSIS — M069 Rheumatoid arthritis, unspecified: Secondary | ICD-10-CM

## 2015-01-14 LAB — POCT RAPID STREP A (OFFICE): RAPID STREP A SCREEN: NEGATIVE

## 2015-01-14 MED ORDER — LEVOFLOXACIN 500 MG PO TABS: 500.0000 mg | ORAL_TABLET | Freq: Every day | ORAL | Status: DC

## 2015-01-14 MED ORDER — HYDROCODONE-HOMATROPINE 5-1.5 MG/5ML PO SYRP: 5.0000 mL | ORAL_SOLUTION | Freq: Three times a day (TID) | ORAL | Status: DC | PRN

## 2015-01-14 NOTE — Progress Notes (Signed)
   Subjective:    Patient ID: Regina Merritt, female    DOB: 02-11-1951, 64 y.o.   MRN: [id number]  HPI  Onset Sunday of URI symptoms with headache and sore throat. Some cough. No shaking chills.    Review of Systems     Objective:   Physical Exam  Rapid strep screen is negative. TMs are slightly full. Pharynx is red without exudate. Neck supple. Chest clear to auscultation without rales or wheezing.      Assessment & Plan:  Headache  Acute upper respiratory infection  Plan: Levaquin 500 milligrams daily for 7 days. Hycodan 1 teaspoon by mouth every 6 hours when necessary cough or headache. Call if not better in 48 hours or sooner if worse. May also take Tylenol for pain.

## 2015-01-19 DIAGNOSIS — M069 Rheumatoid arthritis, unspecified: Secondary | ICD-10-CM | POA: Insufficient documentation

## 2015-01-19 NOTE — Patient Instructions (Signed)
Take Levaquin as directed and Hycodan as directed for cough and headache. Call if not better in 48 hours

## 2015-01-21 ENCOUNTER — Telehealth: Payer: Self-pay | Admitting: Internal Medicine

## 2015-01-21 NOTE — Telephone Encounter (Signed)
Was in last week to see you and she has 2 pills left to take (Levaquin).  The cough syrup has helped, but she still has a LOT of congestion left.  Still running some fever.  She is taking tylenol and it gets rid of the fever but it still seems to creep back up.    Do you want her to come in to the office?

## 2015-01-21 NOTE — Telephone Encounter (Signed)
See Monday if no better.

## 2015-01-21 NOTE — Telephone Encounter (Signed)
Spoke with patient instructed her to complete current antibiotic and to call us back on Monday if no better

## 2015-02-01 ENCOUNTER — Ambulatory Visit
Admission: RE | Admit: 2015-02-01 | Discharge: 2015-02-01 | Disposition: A | Discharge status: Home / Self Care | Payer: 59 | Source: Ambulatory Visit | Attending: Internal Medicine | Admitting: Internal Medicine

## 2015-02-01 ENCOUNTER — Ambulatory Visit (INDEPENDENT_AMBULATORY_CARE_PROVIDER_SITE_OTHER): Payer: 59 | Admitting: Internal Medicine

## 2015-02-01 ENCOUNTER — Encounter: Payer: Self-pay | Admitting: Internal Medicine

## 2015-02-01 VITALS — BP 98/62 | HR 73 | Temp 97.9°F | Wt 120.0 lb

## 2015-02-01 DIAGNOSIS — R059 Cough, unspecified: Secondary | ICD-10-CM

## 2015-02-01 DIAGNOSIS — R05 Cough: Secondary | ICD-10-CM

## 2015-02-01 MED ORDER — PREDNISONE 10 MG PO TABS: ORAL_TABLET | ORAL | Status: DC

## 2015-02-01 MED ORDER — CLARITHROMYCIN 500 MG PO TABS: 500.0000 mg | ORAL_TABLET | Freq: Two times a day (BID) | ORAL | Status: DC

## 2015-02-07 NOTE — Patient Instructions (Signed)
Take prednisone in tapering course as directed. Take Biaxin 500 mg twice daily for 10 days.

## 2015-02-07 NOTE — Progress Notes (Signed)
   Subjective:    Patient ID: Regina Merritt, female    DOB: 01/02/1951, 64 y.o.   MRN: [id number]  HPI Patient was seen February 19 for respiratory infection. Was treated with a seven-day course of Levaquin and Hycodan. Has persistent cough and congestion. Main complaint is cough. No fever or shaking chills. At last visit rapid strep screen was negative. She is on methotrexate for rheumatoid arthritis.    Review of Systems     Objective:   Physical Exam Skin warm and dry. Nodes none. TMs and pharynx are clear. Neck supple. Chest clear to auscultation without rales or wheezing. Chest x-ray no pneumonia.      Assessment & Plan:  Protracted upper respiratory infection  Plan: Sterapred DS 10 mg 6 day dosepak. Biaxin 500 mg twice daily for 10 days.

## 2015-10-17 ENCOUNTER — Telehealth: Payer: Self-pay | Admitting: Internal Medicine

## 2015-10-17 NOTE — Telephone Encounter (Signed)
Pt called, Dr. Ouida Sills from [facility] has been seeing pt, but he has retired.  She would like to be referred to another rheumatologist at another practice if possible.  Best number to call pt is [phone]

## 2015-10-17 NOTE — Telephone Encounter (Signed)
Pt may call Dr. Gavin Pound at [phone] and say she is former pt of Dr. Tonette Bihari. Dr. Trudie Reed practiced with Dr. Ouida Sills but she and another Rheumatologist left that office when he did and are located at [address], [place]

## 2015-10-17 NOTE — Telephone Encounter (Signed)
Spoke with pt, she will call Dr. Tonette Bihari office to get records transferred to Dr. Trudie Reed office.  They are expecting her call to schedule.

## 2015-12-09 MED FILL — FOLIC ACID 1 MG TABLET: 1 | 90 days supply | Qty: 90 | Fill #2

## 2015-12-16 MED FILL — METHOTREXATE 25 MG/ML VIAL: 50 | 84 days supply | Qty: 6 | Fill #2

## 2016-01-25 DIAGNOSIS — M0609 Rheumatoid arthritis without rheumatoid factor, multiple sites: Secondary | ICD-10-CM | POA: Diagnosis not present

## 2016-01-25 DIAGNOSIS — Z79899 Other long term (current) drug therapy: Secondary | ICD-10-CM | POA: Diagnosis not present

## 2016-01-25 DIAGNOSIS — M79642 Pain in left hand: Secondary | ICD-10-CM | POA: Diagnosis not present

## 2016-01-25 DIAGNOSIS — M79641 Pain in right hand: Secondary | ICD-10-CM | POA: Diagnosis not present

## 2016-01-25 DIAGNOSIS — M79671 Pain in right foot: Secondary | ICD-10-CM | POA: Diagnosis not present

## 2016-01-25 DIAGNOSIS — M79672 Pain in left foot: Secondary | ICD-10-CM | POA: Diagnosis not present

## 2016-01-25 MED FILL — FOLIC ACID 1 MG TABLET: 1 | 30 days supply | Qty: 60 | Fill #0

## 2016-03-09 MED FILL — METHOTREXATE 25 MG/ML VIAL: 50 | 84 days supply | Qty: 6 | Fill #3

## 2016-04-26 ENCOUNTER — Encounter: Payer: Self-pay | Admitting: Internal Medicine

## 2016-04-26 ENCOUNTER — Ambulatory Visit (INDEPENDENT_AMBULATORY_CARE_PROVIDER_SITE_OTHER): Payer: 59 | Admitting: Internal Medicine

## 2016-04-26 VITALS — BP 114/68 | HR 70 | Temp 97.5°F | Resp 18 | Wt 120.0 lb

## 2016-04-26 DIAGNOSIS — B880 Other acariasis: Secondary | ICD-10-CM | POA: Diagnosis not present

## 2016-04-26 DIAGNOSIS — M79671 Pain in right foot: Secondary | ICD-10-CM | POA: Diagnosis not present

## 2016-04-26 DIAGNOSIS — Z79899 Other long term (current) drug therapy: Secondary | ICD-10-CM | POA: Diagnosis not present

## 2016-04-26 DIAGNOSIS — M79672 Pain in left foot: Secondary | ICD-10-CM | POA: Diagnosis not present

## 2016-04-26 DIAGNOSIS — M0609 Rheumatoid arthritis without rheumatoid factor, multiple sites: Secondary | ICD-10-CM | POA: Diagnosis not present

## 2016-04-26 MED ORDER — TRIAMCINOLONE ACETONIDE 0.1 % EX CREA: 1.0000 "application " | TOPICAL_CREAM | Freq: Three times a day (TID) | CUTANEOUS | Status: DC

## 2016-04-26 MED FILL — TRIAMCINOLONE 0.1% CREAM: 0.1 | 15 days supply | Qty: 45 | Fill #0

## 2016-04-26 NOTE — Progress Notes (Signed)
   Subjective:    Patient ID: Regina Merritt, female    DOB: Oct 22, 1951, 66 y.o.   MRN: [id number]  HPI 65 year old Female in today with itchy rash on abdomen and lateral trunk area. Has not been doing yard work. Has been walking her dog. Can't get much relief due to itching. She does take methotrexate for rheumatoid arthritis along with folic acid.    Review of Systems see above     Objective:   Physical Exam  There are multiple red papules on her anterior abdomen and left lateral area. One discrete papule right axillary line. These papules have a central punctum consistent with bites.      Assessment & Plan:   Chigger bites  Plan: She has already applied clear nail polish to the bite areas. Have prescribed triamcinolone cream to use 3 times daily. May take Benadryl for itching as well.

## 2016-04-26 NOTE — Patient Instructions (Addendum)
Triamcinolone cream 0.1% to bites 3 times daily. May take Benadryl. Clear nail polish to bites.

## 2016-06-04 MED FILL — METHOTREXATE 25 MG/ML VIAL: 50 | 84 days supply | Qty: 6 | Fill #0

## 2016-08-01 DIAGNOSIS — M0609 Rheumatoid arthritis without rheumatoid factor, multiple sites: Secondary | ICD-10-CM | POA: Diagnosis not present

## 2016-08-01 DIAGNOSIS — Z79899 Other long term (current) drug therapy: Secondary | ICD-10-CM | POA: Diagnosis not present

## 2016-08-01 MED FILL — FOLIC ACID 1 MG TABLET: 1 | 30 days supply | Qty: 60 | Fill #0

## 2016-08-23 MED FILL — METHOTREXATE 25 MG/ML VIAL: 50 | 84 days supply | Qty: 6 | Fill #1

## 2016-09-04 MED FILL — FOLIC ACID 1 MG TABLET: 1 | 30 days supply | Qty: 60 | Fill #1 | Status: TO

## 2016-10-04 ENCOUNTER — Ambulatory Visit (INDEPENDENT_AMBULATORY_CARE_PROVIDER_SITE_OTHER): Payer: Medicare Other | Admitting: Internal Medicine

## 2016-10-04 DIAGNOSIS — Z23 Encounter for immunization: Secondary | ICD-10-CM

## 2016-11-05 DIAGNOSIS — Z79899 Other long term (current) drug therapy: Secondary | ICD-10-CM | POA: Diagnosis not present

## 2016-11-05 DIAGNOSIS — M0609 Rheumatoid arthritis without rheumatoid factor, multiple sites: Secondary | ICD-10-CM | POA: Diagnosis not present

## 2016-11-05 DIAGNOSIS — R636 Underweight: Secondary | ICD-10-CM | POA: Diagnosis not present

## 2016-11-05 DIAGNOSIS — Z681 Body mass index (BMI) 19 or less, adult: Secondary | ICD-10-CM | POA: Diagnosis not present

## 2016-11-05 DIAGNOSIS — M2550 Pain in unspecified joint: Secondary | ICD-10-CM | POA: Diagnosis not present

## 2016-12-10 MED FILL — METHOTREXATE 25 MG/ML VIAL: 50 | 55 days supply | Qty: 4 | Fill #2

## 2017-02-08 DIAGNOSIS — Z681 Body mass index (BMI) 19 or less, adult: Secondary | ICD-10-CM | POA: Diagnosis not present

## 2017-02-08 DIAGNOSIS — M0609 Rheumatoid arthritis without rheumatoid factor, multiple sites: Secondary | ICD-10-CM | POA: Diagnosis not present

## 2017-02-08 DIAGNOSIS — Z79899 Other long term (current) drug therapy: Secondary | ICD-10-CM | POA: Diagnosis not present

## 2017-02-08 MED FILL — METHOTREXATE 25 MG/ML VIAL: 50 | 56 days supply | Qty: 4 | Fill #0

## 2017-03-01 MED FILL — BD TB SYRINGE 27GX1/2": 27G X 1/2" | 30 days supply | Qty: 12 | Fill #0

## 2017-03-01 MED FILL — BD TB SYRINGE 27GX1/2: 27G X 1/2" | 30 days supply | Qty: 12 | Fill #0

## 2017-04-09 MED FILL — METHOTREXATE 25 MG/ML VIAL: 50 | 56 days supply | Qty: 4 | Fill #1

## 2017-05-13 DIAGNOSIS — M2550 Pain in unspecified joint: Secondary | ICD-10-CM | POA: Diagnosis not present

## 2017-05-13 DIAGNOSIS — M0609 Rheumatoid arthritis without rheumatoid factor, multiple sites: Secondary | ICD-10-CM | POA: Diagnosis not present

## 2017-05-13 DIAGNOSIS — Z79899 Other long term (current) drug therapy: Secondary | ICD-10-CM | POA: Diagnosis not present

## 2017-05-13 DIAGNOSIS — Z681 Body mass index (BMI) 19 or less, adult: Secondary | ICD-10-CM | POA: Diagnosis not present

## 2017-05-27 MED FILL — BD TB SYRINGE 27GX1/2: 27G X 1/2" | 30 days supply | Qty: 12 | Fill #1

## 2017-05-27 MED FILL — BD TB SYRINGE 27GX1/2": 27G X 1/2" | 30 days supply | Qty: 12 | Fill #1

## 2017-08-15 MED FILL — BD TB SYRINGE 27GX1/2": 27G X 1/2" | 30 days supply | Qty: 12 | Fill #2

## 2017-08-15 MED FILL — METHOTREXATE 25 MG/ML VIAL: 50 | 56 days supply | Qty: 4 | Fill #2

## 2017-08-15 MED FILL — BD TB SYRINGE 27GX1/2: 27G X 1/2" | 30 days supply | Qty: 12 | Fill #2

## 2017-08-20 DIAGNOSIS — M2550 Pain in unspecified joint: Secondary | ICD-10-CM | POA: Diagnosis not present

## 2017-08-20 DIAGNOSIS — M0609 Rheumatoid arthritis without rheumatoid factor, multiple sites: Secondary | ICD-10-CM | POA: Diagnosis not present

## 2017-08-20 DIAGNOSIS — Z79899 Other long term (current) drug therapy: Secondary | ICD-10-CM | POA: Diagnosis not present

## 2017-08-20 DIAGNOSIS — Z681 Body mass index (BMI) 19 or less, adult: Secondary | ICD-10-CM | POA: Diagnosis not present

## 2017-09-12 MED FILL — LIDOCAINE-PRILOCAINE CREAM: 2.5-2.5 | 10 days supply | Qty: 30 | Fill #0

## 2017-11-08 MED FILL — BD TB SYRINGE 27GX1/2: 27G X 1/2" | 30 days supply | Qty: 12 | Fill #3

## 2017-11-08 MED FILL — METHOTREXATE 25 MG/ML VIAL: 50 | 56 days supply | Qty: 4 | Fill #3

## 2017-11-08 MED FILL — BD TB SYRINGE 27GX1/2": 27G X 1/2" | 30 days supply | Qty: 12 | Fill #3

## 2017-11-11 ENCOUNTER — Encounter: Payer: Self-pay | Admitting: Internal Medicine

## 2017-11-11 ENCOUNTER — Ambulatory Visit (INDEPENDENT_AMBULATORY_CARE_PROVIDER_SITE_OTHER): Payer: Medicare Other | Admitting: Internal Medicine

## 2017-11-11 VITALS — BP 120/70 | HR 58 | Temp 98.1°F | Wt 128.0 lb

## 2017-11-11 DIAGNOSIS — M7061 Trochanteric bursitis, right hip: Secondary | ICD-10-CM

## 2017-11-11 DIAGNOSIS — M545 Low back pain, unspecified: Secondary | ICD-10-CM

## 2017-11-11 DIAGNOSIS — Z8739 Personal history of other diseases of the musculoskeletal system and connective tissue: Secondary | ICD-10-CM | POA: Diagnosis not present

## 2017-11-11 NOTE — Patient Instructions (Signed)
Trial of Mobic 15 mg daily for right hip pain and back pain.  Patient is to discuss this treatment with rheumatologist tomorrow.

## 2017-11-11 NOTE — Progress Notes (Signed)
   Subjective:    Patient ID: Regina Merritt, female    DOB: [birth year], 66 y.o.   MRN: [id number]  HPI Recently noticed some pain in her right lateral hip and right posterior back.  Is helping some operate [employer] and has been doing a lot of walking around the property since before Halloween.  No injury or fall.  She is on methotrexate per Dr. Lenna Gilford for rheumatoid arthritis.  She does not like taking it.  She is to see Dr. Lenna Gilford tomorrow for discussion.    Review of Systems see above     Objective:   Physical Exam She is tender over her right greater trochanter.  Internal and external rotation of right hip is essentially normal with some mild pain right external hip rotation around the right trochanter straight leg raising is negative at 90 degrees and muscle strength is normal in the right lower extremity       Assessment & Plan:  Rheumatoid arthritis  Right trochanteric bursitis  Musculoskeletal pain right posterior superior iliac spine  Plan: I have given her prescription for Mobic 15 mg daily.  I have asked her to discuss whether or not Dr. Lenna Gilford agrees with that tomorrow when she sees her.  She may need some physical therapy for the trochanteric bursitis and right back pain.

## 2017-11-12 DIAGNOSIS — M0609 Rheumatoid arthritis without rheumatoid factor, multiple sites: Secondary | ICD-10-CM | POA: Diagnosis not present

## 2017-11-12 DIAGNOSIS — R5383 Other fatigue: Secondary | ICD-10-CM | POA: Diagnosis not present

## 2017-11-12 DIAGNOSIS — M2550 Pain in unspecified joint: Secondary | ICD-10-CM | POA: Diagnosis not present

## 2017-11-12 DIAGNOSIS — Z79899 Other long term (current) drug therapy: Secondary | ICD-10-CM | POA: Diagnosis not present

## 2017-11-12 DIAGNOSIS — Z6820 Body mass index (BMI) 20.0-20.9, adult: Secondary | ICD-10-CM | POA: Diagnosis not present

## 2017-11-14 ENCOUNTER — Ambulatory Visit: Payer: Medicare Other | Admitting: Internal Medicine

## 2017-11-14 DIAGNOSIS — Z23 Encounter for immunization: Secondary | ICD-10-CM | POA: Diagnosis not present

## 2018-09-30 DIAGNOSIS — Z23 Encounter for immunization: Secondary | ICD-10-CM | POA: Diagnosis not present

## 2019-07-29 ENCOUNTER — Other Ambulatory Visit: Payer: Self-pay

## 2019-07-29 ENCOUNTER — Other Ambulatory Visit (INDEPENDENT_AMBULATORY_CARE_PROVIDER_SITE_OTHER): Payer: Medicare Other | Admitting: Internal Medicine

## 2019-07-29 DIAGNOSIS — M79641 Pain in right hand: Secondary | ICD-10-CM | POA: Diagnosis not present

## 2019-07-29 DIAGNOSIS — G4700 Insomnia, unspecified: Secondary | ICD-10-CM

## 2019-07-29 DIAGNOSIS — M8580 Other specified disorders of bone density and structure, unspecified site: Secondary | ICD-10-CM | POA: Diagnosis not present

## 2019-07-29 DIAGNOSIS — M79642 Pain in left hand: Secondary | ICD-10-CM

## 2019-07-29 DIAGNOSIS — M069 Rheumatoid arthritis, unspecified: Secondary | ICD-10-CM

## 2019-07-29 NOTE — Progress Notes (Signed)
Labs drawn by CMA

## 2019-07-29 NOTE — Patient Instructions (Signed)
Labs drawn and pending

## 2019-07-30 ENCOUNTER — Other Ambulatory Visit: Payer: PRIVATE HEALTH INSURANCE | Admitting: Internal Medicine

## 2019-07-30 LAB — COMPLETE METABOLIC PANEL WITH GFR
AG Ratio: 1.6 (calc) (ref 1.0–2.5)
ALT: 12 U/L (ref 6–29)
AST: 14 U/L (ref 10–35)
Albumin: 4 g/dL (ref 3.6–5.1)
Alkaline phosphatase (APISO): 47 U/L (ref 37–153)
BUN/Creatinine Ratio: 14 (calc) (ref 6–22)
BUN: 14 mg/dL (ref 7–25)
CO2: 27 mmol/L (ref 20–32)
Calcium: 9.4 mg/dL (ref 8.6–10.4)
Chloride: 105 mmol/L (ref 98–110)
Creat: 1.02 mg/dL — ABNORMAL HIGH (ref 0.50–0.99)
GFR, Est African American: 66 mL/min/{1.73_m2} (ref >=60)
GFR, Est Non African American: 57 mL/min/{1.73_m2} — ABNORMAL LOW (ref >=60)
Globulin: 2.5 g/dL (calc) (ref 1.9–3.7)
Glucose, Bld: 85 mg/dL (ref 65–99)
Potassium: 4.6 mmol/L (ref 3.5–5.3)
Sodium: 140 mmol/L (ref 135–146)
Total Bilirubin: 0.6 mg/dL (ref 0.2–1.2)
Total Protein: 6.5 g/dL (ref 6.1–8.1)

## 2019-07-30 LAB — TSH: TSH: 1.99 mIU/L (ref 0.40–4.50)

## 2019-07-30 LAB — CBC WITH DIFFERENTIAL/PLATELET
Absolute Monocytes: 551 cells/uL (ref 200–950)
Basophils Absolute: 21 cells/uL (ref 0–200)
Basophils Relative: 0.4 %
Eosinophils Absolute: 109 cells/uL (ref 15–500)
Eosinophils Relative: 2.1 %
HCT: 40.2 % (ref 35.0–45.0)
Hemoglobin: 13 g/dL (ref 11.7–15.5)
Lymphs Abs: 936 cells/uL (ref 850–3900)
MCH: 29.5 pg (ref 27.0–33.0)
MCHC: 32.3 g/dL (ref 32.0–36.0)
MCV: 91.2 fL (ref 80.0–100.0)
MPV: 11.2 fL (ref 7.5–12.5)
Monocytes Relative: 10.6 %
Neutro Abs: 3583 cells/uL (ref 1500–7800)
Neutrophils Relative %: 68.9 %
Platelets: 212 10*3/uL (ref 140–400)
RBC: 4.41 10*6/uL (ref 3.80–5.10)
RDW: 12.6 % (ref 11.0–15.0)
Total Lymphocyte: 18 %
WBC: 5.2 10*3/uL (ref 3.8–10.8)

## 2019-07-30 LAB — LIPID PANEL
Cholesterol: 173 mg/dL (ref <200)
HDL: 55 mg/dL (ref >=50)
LDL Cholesterol (Calc): 105 mg/dL (calc) — ABNORMAL HIGH
Non-HDL Cholesterol (Calc): 118 mg/dL (calc) (ref <130)
Total CHOL/HDL Ratio: 3.1 (calc) (ref <5.0)
Triglycerides: 52 mg/dL (ref <150)

## 2019-08-06 ENCOUNTER — Encounter: Payer: Self-pay | Admitting: Internal Medicine

## 2019-08-06 ENCOUNTER — Ambulatory Visit (INDEPENDENT_AMBULATORY_CARE_PROVIDER_SITE_OTHER): Payer: Medicare Other | Admitting: Internal Medicine

## 2019-08-06 ENCOUNTER — Other Ambulatory Visit: Payer: Self-pay

## 2019-08-06 VITALS — BP 102/70 | HR 65 | Temp 98.3°F | Ht 65.0 in | Wt 108.0 lb

## 2019-08-06 DIAGNOSIS — Z1211 Encounter for screening for malignant neoplasm of colon: Secondary | ICD-10-CM | POA: Diagnosis not present

## 2019-08-06 DIAGNOSIS — M8580 Other specified disorders of bone density and structure, unspecified site: Secondary | ICD-10-CM | POA: Diagnosis not present

## 2019-08-06 DIAGNOSIS — Z23 Encounter for immunization: Secondary | ICD-10-CM | POA: Diagnosis not present

## 2019-08-06 DIAGNOSIS — Z1231 Encounter for screening mammogram for malignant neoplasm of breast: Secondary | ICD-10-CM | POA: Diagnosis not present

## 2019-08-06 DIAGNOSIS — R7989 Other specified abnormal findings of blood chemistry: Secondary | ICD-10-CM

## 2019-08-06 DIAGNOSIS — Z0000 Encounter for general adult medical examination without abnormal findings: Secondary | ICD-10-CM | POA: Diagnosis not present

## 2019-08-06 LAB — POCT URINALYSIS DIPSTICK
Appearance: NEGATIVE
Bilirubin, UA: NEGATIVE
Blood, UA: NEGATIVE
Glucose, UA: NEGATIVE
Ketones, UA: NEGATIVE
Leukocytes, UA: NEGATIVE
Nitrite, UA: NEGATIVE
Odor: NEGATIVE
Protein, UA: NEGATIVE
Spec Grav, UA: 1.01 (ref 1.010–1.025)
Urobilinogen, UA: 0.2 E.U./dL
pH, UA: 6.5 (ref 5.0–8.0)

## 2019-08-06 LAB — CREATININE, SERUM: Creat: 1.08 mg/dL — ABNORMAL HIGH (ref 0.50–0.99)

## 2019-08-06 NOTE — Progress Notes (Signed)
Subjective:    Patient ID: Regina Merritt, female    DOB: 03-Oct-1951, 68 y.o.   MRN: [id number]  HPI 68 year old Female in today for welcome to Medicare physical examination.  Prevnar 13 given today.  EKG is within normal limits.  Her general health is excellent.  Remote history of migraine headaches that went away when she became menopausal.  History of hand arthritis diagnosed by Dr. Ouida Sills in 2013 treated with methotrexate.  She had a positive ANA with speckled pattern and a negative CCP.  Past medical history: She is allergic to Penicillin it causes hives.  Intolerant of Keflex as well.  History of osteopenia.  Became menopausal around 2005.  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  Had depression in 1998.  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  Had bilateral tubal ligation 1989.  She took Actonel well for osteopenia.  No recent bone density study.  Saw Dr. 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  Was felt to be a good candidate for laser ablation and stab phlebectomy.  History of onychomycosis treated with Sporanox 1999.  Social history: She has been married twice.  Has 2 children.  Retired as a Environmental education officer at [employer].  Non-smoker.  No alcohol consumption.  Family history: 1 brother alive and well.  Father with history of hypertension.  She has a very mild elevated creatinine at 1.02 and will return for follow-up when she is well-hydrated.  Addendum: She returned and creatinine was even higher at 1.08.  Recommended further hydration with repeat serum creatinine in the near future.  Dipstick urine is normal, BUN is normal.  Very mild elevation of LDL at 105 which will be treated with diet.  Does not want colonoscopy.  Order placed for Cologuard.  Orders placed for bone density study and mammogram.    Review of Systems  Constitutional: Negative.   All other systems reviewed and are negative.       Objective:   Physical Exam Vitals signs reviewed.  Constitutional:      General: She is not in acute distress.    Appearance: Normal appearance. She is not toxic-appearing or diaphoretic.  HENT:     Head: Normocephalic and atraumatic.     Right Ear: Tympanic membrane normal.     Left Ear: Tympanic membrane normal.     Nose: Nose normal.     Mouth/Throat:     Mouth: Mucous membranes are moist.     Pharynx: Oropharynx is clear.  Eyes:     General: No scleral icterus.    Extraocular Movements: Extraocular movements intact.     Conjunctiva/sclera: Conjunctivae normal.     Pupils: Pupils are equal, round, and reactive to light.  Neck:     Musculoskeletal: Neck supple. No neck rigidity.     Vascular: No carotid bruit.     Comments: No thyromegaly Cardiovascular:     Rate and Rhythm: Normal rate and regular rhythm.     Pulses: Normal pulses.     Heart sounds: Normal heart sounds. No murmur.  Pulmonary:     Effort: Pulmonary effort is normal.     Breath sounds: Normal breath sounds. No wheezing or rales.     Comments: Breast without masses Abdominal:     General: Bowel sounds are normal. There is no distension.     Palpations: Abdomen is soft. There is no mass.     Tenderness: There is no guarding or  rebound.  Genitourinary:    General: Normal vulva.     Rectum: Normal.     Comments: Pap deferred due to age.  Bimanual normal. Musculoskeletal:        General: No deformity.     Right lower leg: No edema.     Left lower leg: No edema.  Lymphadenopathy:     Cervical: No cervical adenopathy.  Skin:    General: Skin is warm and dry.  Neurological:     General: No focal deficit present.     Mental Status: She is alert and oriented to person, place, and time.     Cranial Nerves: No cranial nerve deficit.     Sensory: No sensory deficit.     Coordination: Coordination normal.     Gait: Gait normal.  Psychiatric:        Mood and Affect: Mood normal.        Behavior: Behavior  normal.        Thought Content: Thought content normal.        Judgment: Judgment normal.           Assessment & Plan:  Normal health maintenance exam  Prevnar 13 given  Welcome to Medicare exam  Very mild elevation of serum creatinine and will repeat were well-hydrated  I placed orders for mammogram and bone density study.  Subjective:   Patient presents for Medicare Annual/Subsequent preventive examination.  Review Past Medical/Family/Social: See above  Risk Factors  Current exercise habits: Walks Dietary issues discussed: Low-fat low carbohydrate  Cardiac risk factors: No significant factors  Depression Screen  (Note: if answer to either of the following is "Yes", a more complete depression screening is indicated)   Over the past two weeks, have you felt down, depressed or hopeless? No  Over the past two weeks, have you felt little interest or pleasure in doing things? No Have you lost interest or pleasure in daily life? No Do you often feel hopeless? No Do you cry easily over simple problems? No   Activities of Daily Living  In your present state of health, do you have any difficulty performing the following activities?:   Driving? No  Managing money? No  Feeding yourself? No  Getting from bed to chair? No  Climbing a flight of stairs? No  Preparing food and eating?: No  Bathing or showering? No  Getting dressed: No  Getting to the toilet? No  Using the toilet:No  Moving around from place to place: No  In the past year have you fallen or had a near fall?:No  Are you sexually active? No  Do you have more than one partner? No   Hearing Difficulties: No  Do you often ask people to speak up or repeat themselves? No  Do you experience ringing or noises in your ears? No  Do you have difficulty understanding soft or whispered voices? No  Do you feel that you have a problem with memory? No Do you often misplace items? No    Home Safety:  Do you have a  smoke alarm at your residence? Yes Do you have grab bars in the bathroom?  Yes Do you have throw rugs in your house?  Yes   Cognitive Testing  Alert? Yes Normal Appearance?Yes  Oriented to person? Yes Place? Yes  Time? Yes  Recall of three objects? Yes  Can perform simple calculations? Yes  Displays appropriate judgment?Yes  Can read the correct time from a watch face?Yes   List  the Names of Other Physician/Practitioners you currently use:  See referral list for the physicians patient is currently seeing.     Review of Systems: See above   Objective:     General appearance: Appears younger than stated age and trim Head: Normocephalic, without obvious abnormality, atraumatic  Eyes: conj clear, EOMi PEERLA  Ears: normal TM's and external ear canals both ears  Nose: Nares normal. Septum midline. Mucosa normal. No drainage or sinus tenderness.  Throat: lips, mucosa, and tongue normal; teeth and gums normal  Neck: no adenopathy, no carotid bruit, no JVD, supple, symmetrical, trachea midline and thyroid not enlarged, symmetric, no tenderness/mass/nodules  No CVA tenderness.  Lungs: clear to auscultation bilaterally  Breasts: normal appearance, no masses  Heart: regular rate and rhythm, S1, S2 normal, no murmur, click, rub or gallop  Abdomen: soft, non-tender; bowel sounds normal; no masses, no organomegaly  Musculoskeletal: ROM normal in all joints, no crepitus, no deformity, Normal muscle strengthen. Back  is symmetric, no curvature. Skin: Skin color, texture, turgor normal. No rashes or lesions  Lymph nodes: Cervical, supraclavicular, and axillary nodes normal.  Neurologic: CN 2 -12 Normal, Normal symmetric reflexes. Normal coordination and gait  Psych: Alert & Oriented x 3, Mood appear stable.    Assessment:    Annual wellness medicare exam   Plan:    During the course of the visit the patient was educated and counseled about appropriate screening and preventive  services including:   Prevnar 13 given  Bone density and mammogram ordered  Cologuard order placed     Patient Instructions (the written plan) was given to the patient.  Medicare Attestation  I have personally reviewed:  The patient's medical and social history  Their use of alcohol, tobacco or illicit drugs  Their current medications and supplements  The patient's functional ability including ADLs,fall risks, home safety risks, cognitive, and hearing and visual impairment  Diet and physical activities  Evidence for depression or mood disorders  The patient's weight, height, BMI, and visual acuity have been recorded in the chart. I have made referrals, counseling, and provided education to the patient based on review of the above and I have provided the patient with a written personalized care plan for preventive services.

## 2019-08-10 ENCOUNTER — Other Ambulatory Visit: Payer: Medicare Other | Admitting: Internal Medicine

## 2019-08-19 DIAGNOSIS — Z1211 Encounter for screening for malignant neoplasm of colon: Secondary | ICD-10-CM | POA: Diagnosis not present

## 2019-08-22 ENCOUNTER — Encounter: Payer: Self-pay | Admitting: Internal Medicine

## 2019-08-22 LAB — COLOGUARD: Cologuard: NEGATIVE

## 2019-08-22 NOTE — Patient Instructions (Addendum)
It was a pleasure to see you today.  Cologuard order placed.  Prevnar 13 given.  Return in 1 year or as needed.  Would like to repeat serum creatinine when you are well-hydrated.  It is only very mildly elevated.  EKG is within normal limits.  Have annual mammogram.  Recommend flu vaccine.

## 2019-08-24 ENCOUNTER — Encounter: Payer: Self-pay | Admitting: Internal Medicine

## 2019-08-31 ENCOUNTER — Encounter: Payer: Self-pay | Admitting: Internal Medicine

## 2019-09-05 DIAGNOSIS — Z23 Encounter for immunization: Secondary | ICD-10-CM | POA: Diagnosis not present

## 2019-10-30 ENCOUNTER — Ambulatory Visit: Payer: 59

## 2019-10-30 ENCOUNTER — Other Ambulatory Visit: Payer: 59

## 2020-03-02 ENCOUNTER — Telehealth: Payer: Self-pay | Admitting: Internal Medicine

## 2020-03-02 NOTE — Telephone Encounter (Signed)
LVM to call back and schedule CPE for after 08/05/20, we received notification from insurance company about plan of care.

## 2020-03-31 ENCOUNTER — Ambulatory Visit (INDEPENDENT_AMBULATORY_CARE_PROVIDER_SITE_OTHER): Payer: PPO | Admitting: Podiatry

## 2020-03-31 ENCOUNTER — Other Ambulatory Visit: Payer: Self-pay

## 2020-03-31 ENCOUNTER — Ambulatory Visit (INDEPENDENT_AMBULATORY_CARE_PROVIDER_SITE_OTHER): Payer: PPO

## 2020-03-31 DIAGNOSIS — M778 Other enthesopathies, not elsewhere classified: Secondary | ICD-10-CM

## 2020-03-31 DIAGNOSIS — M722 Plantar fascial fibromatosis: Secondary | ICD-10-CM | POA: Diagnosis not present

## 2020-03-31 MED ORDER — METHYLPREDNISOLONE 4 MG PO TBPK: ORAL_TABLET | ORAL | 0 refills | Status: DC

## 2020-03-31 MED ORDER — MELOXICAM 15 MG PO TABS: 15.0000 mg | ORAL_TABLET | Freq: Every day | ORAL | 3 refills | Status: DC

## 2020-03-31 MED FILL — MELOXICAM 15 MG TABLET: 15 | 30 days supply | Qty: 30 | Fill #0

## 2020-03-31 MED FILL — METHYLPREDNISOLONE 4 MG TBP: 4 | 6 days supply | Qty: 21 | Fill #0

## 2020-03-31 NOTE — Progress Notes (Signed)
  Subjective:  Patient ID: Regina Merritt, female    DOB: 12/22/1950,  MRN: [id number] HPI Chief Complaint  Patient presents with  . New Patient (Initial Visit)  . Foot Pain    left foot , low arch pain to heel. does yoga to help with stretching and has bought otc insoles from [facility]. pt states sharp and burning pain that has been ongoing for 1 year. does complain of discomfort first thing in AM and can't place foot down. does get better after walking for a while.     69 y.o. female presents with the above complaint.   ROS: Denies fever chills nausea vomiting muscle aches pains calf pain back pain chest pain shortness of breath.  No past medical history on file. No past surgical history on file.  Current Outpatient Medications:  .  meloxicam (MOBIC) 15 MG tablet, Take 1 tablet (15 mg total) by mouth daily., Disp: 30 tablet, Rfl: 3 .  methylPREDNISolone (MEDROL DOSEPAK) 4 MG TBPK tablet, 6 day dose pack - take as directed, Disp: 21 tablet, Rfl: 0  Allergies  Allergen Reactions  . Cephalexin   . Penicillins    Review of Systems Objective:  There were no vitals filed for this visit.  General: Well developed, nourished, in no acute distress, alert and oriented x3   Dermatological: Skin is warm, dry and supple bilateral. Nails x 10 are well maintained; remaining integument appears unremarkable at this time. There are no open sores, no preulcerative lesions, no rash or signs of infection present.  Vascular: Dorsalis Pedis artery and Posterior Tibial artery pedal pulses are 2/4 bilateral with immedate capillary fill time. Pedal hair growth present. No varicosities and no lower extremity edema present bilateral.   Neruologic: Grossly intact via light touch bilateral. Vibratory intact via tuning fork bilateral. Protective threshold with Semmes Wienstein monofilament intact to all pedal sites bilateral. Patellar and Achilles deep tendon reflexes 2+ bilateral. No Babinski or clonus noted  bilateral.   Musculoskeletal: No gross boney pedal deformities bilateral. No pain, crepitus, or limitation noted with foot and ankle range of motion bilateral. Muscular strength 5/5 in all groups tested bilateral.  Moderate to severe pain on palpation medial calcaneal tubercles of the left heel.  No pain on medial lateral compression of calcaneus.  Gait: Unassisted, Nonantalgic.    Radiographs:  Radiographs taken today demonstrate soft tissue increase in density plantar fascial cannula insertion site of the left heel.  No acute findings are noted.  Assessment & Plan:   Assessment: Plan fasciitis left.  Plan: Discussed etiology pathology conservative versus surgical therapies.  Start her on a Medrol Dosepak to be followed by meloxicam.  Injected the left heel today with 20 mg Kenalog 5 mg Marcaine point maximal tenderness.  Also started on methylprednisolone to be followed by meloxicam.     Davonta Stroot T. [place], [place]

## 2020-03-31 NOTE — Patient Instructions (Signed)

## 2020-05-10 ENCOUNTER — Ambulatory Visit: Payer: PPO | Admitting: Podiatry

## 2020-05-26 ENCOUNTER — Ambulatory Visit: Payer: PPO | Admitting: Podiatry

## 2020-06-21 ENCOUNTER — Other Ambulatory Visit: Payer: Self-pay

## 2020-06-21 ENCOUNTER — Telehealth: Payer: Self-pay | Admitting: Internal Medicine

## 2020-06-21 NOTE — Telephone Encounter (Signed)
OV next week.

## 2020-06-21 NOTE — Telephone Encounter (Signed)
Regina Merritt  [phone]  [name] called to say she has a large vein on the back of leg that seems to be getting larger and she is concerned about it, she would like to talk to you about it. I suggested an office visit so you can see it.

## 2020-06-21 NOTE — Telephone Encounter (Signed)
Appointment scheduled.

## 2020-06-30 ENCOUNTER — Ambulatory Visit (INDEPENDENT_AMBULATORY_CARE_PROVIDER_SITE_OTHER): Payer: PPO | Admitting: Internal Medicine

## 2020-06-30 ENCOUNTER — Other Ambulatory Visit: Payer: Self-pay

## 2020-06-30 ENCOUNTER — Encounter: Payer: Self-pay | Admitting: Internal Medicine

## 2020-06-30 VITALS — BP 120/80 | HR 70 | Ht 65.0 in | Wt 118.0 lb

## 2020-06-30 DIAGNOSIS — I8390 Asymptomatic varicose veins of unspecified lower extremity: Secondary | ICD-10-CM

## 2020-06-30 NOTE — Progress Notes (Signed)
   Subjective:    Patient ID: Regina Merritt, female    DOB: 1951/07/07, 69 y.o.   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 No history of pain or phlebitis.  Had [facility] exam Sept 2020. See PMH- saw Dr. Donnetta Hutching 2011 for varicosities right leg treated with compression stockings.  Hx of migraine headaches infrequent. Non-smoker.  Review of Systems     Objective:   Physical Exam BP 120/80 pulse 70 Weight 118 pounds BMI 19.64   Right posterior thigh has 6 cm tortuous superficial vein without redness or tenderness     Assessment & Plan:  Superficial posterior thigh varicosity patient would like treated  Plan: Refer to Dr. Donnetta Hutching for evaluation. Patient agrees.

## 2020-06-30 NOTE — Patient Instructions (Signed)
Referral to vascular surgery for consideration of ablation/sclerotherapy of large varicosity right posterior

## 2020-08-04 ENCOUNTER — Other Ambulatory Visit: Payer: Self-pay

## 2020-08-04 DIAGNOSIS — I83899 Varicose veins of unspecified lower extremities with other complications: Secondary | ICD-10-CM

## 2020-08-17 ENCOUNTER — Ambulatory Visit: Payer: PPO | Admitting: Physician Assistant

## 2020-08-17 ENCOUNTER — Ambulatory Visit (HOSPITAL_COMMUNITY)
Admission: RE | Admit: 2020-08-17 | Discharge: 2020-08-17 | Disposition: A | Discharge status: Home / Self Care | Payer: PPO | Source: Ambulatory Visit | Attending: Vascular Surgery | Admitting: Vascular Surgery

## 2020-08-17 ENCOUNTER — Other Ambulatory Visit: Payer: Self-pay

## 2020-08-17 VITALS — BP 113/58 | HR 60 | Temp 97.9°F | Resp 20 | Ht 67.0 in | Wt 121.2 lb

## 2020-08-17 DIAGNOSIS — I83899 Varicose veins of unspecified lower extremities with other complications: Secondary | ICD-10-CM | POA: Insufficient documentation

## 2020-08-17 NOTE — Progress Notes (Signed)
[facility] [place]   Reason for referral: Swollen right posterior vein leg  History of Present Illness  Regina Merritt is a 69 y.o. female who presents with chief complaint: swollen leg.  Patient notes, onset of aching enlarged posterior thigh varicose vein started years ago and has become more enlarged over the last few months ago, associated with prolonged sitting.  The patient has had no history of DVT, positive history of varicose vein, no history of venous stasis ulcers, no history of  Lymphedema and no history of skin changes in lower legs.  There is no family history of venous disorders.  The patient has  used compression stockings in the past.  No past medical history on file.  Negative for DM, CAD or PAD  No past surgical history on file.  Social History   Socioeconomic History  . Marital status: Married    Spouse name: Not on file  . Number of children: 2  . Years of education: Not on file  . Highest education level: Not on file  Occupational History  . Not on file  Tobacco Use  . Smoking status: Former Smoker    Types: Cigarettes    Quit date: 09/20/1986    Years since quitting: 33.9  . Smokeless tobacco: Never Used  Substance and Sexual Activity  . Alcohol use: Yes    Comment: occasional  . Drug use: No  . Sexual activity: Not on file  Other Topics Concern  . Not on file  Social History Narrative  . Not on file   Social Determinants of Health   Financial Resource Strain:   . Difficulty of Paying Living Expenses: Not on file  Food Insecurity:   . Worried About Charity fundraiser in the Last Year: Not on file  . Ran Out of Food in the Last Year: Not on file  Transportation Needs:   . Lack of Transportation (Medical): Not on file  . Lack of Transportation (Non-Medical): Not on file  Physical Activity:   . Days of Exercise per Week: Not on file  . Minutes of Exercise per Session: Not on file  Stress:   . Feeling of Stress : Not on  file  Social Connections:   . Frequency of Communication with Friends and Family: Not on file  . Frequency of Social Gatherings with Friends and Family: Not on file  . Attends Religious Services: Not on file  . Active Member of Clubs or Organizations: Not on file  . Attends Archivist Meetings: Not on file  . Marital Status: Not on file  Intimate Partner Violence:   . Fear of Current or Ex-Partner: Not on file  . Emotionally Abused: Not on file  . Physically Abused: Not on file  . Sexually Abused: Not on file    Family History  Problem Relation Age of Onset  . Heart failure Father        Deceased    No current outpatient medications on file prior to visit.   No current facility-administered medications on file prior to visit.    Allergies as of 08/17/2020 - Review Complete 08/17/2020  Allergen Reaction Noted  . Penicillins  09/22/2011     ROS:   General:  No weight loss, Fever, chills  HEENT: No recent headaches, no nasal bleeding, no visual changes, no sore throat  Neurologic: No dizziness, blackouts, seizures. No recent symptoms of stroke or mini- stroke. No recent episodes of slurred speech, or temporary  blindness.  Cardiac: No recent episodes of chest pain/pressure, no shortness of breath at rest.  No shortness of breath with exertion.  Denies history of atrial fibrillation or irregular heartbeat  Vascular: No history of rest pain in feet.  No history of claudication.  No history of non-healing ulcer, No history of DVT   Pulmonary: No home oxygen, no productive cough, no hemoptysis,  No asthma or wheezing  Musculoskeletal:  [ ]  Arthritis, [ ]  Low back pain,  [ ]  Joint pain  Hematologic:No history of hypercoagulable state.  No history of easy bleeding.  No history of anemia  Gastrointestinal: No hematochezia or melena,  No gastroesophageal reflux, no trouble swallowing  Urinary: [ ]  chronic Kidney disease, [ ]  on HD - [ ]  MWF or [ ]  TTHS, [ ]  Burning  with urination, [ ]  Frequent urination, [ ]  Difficulty urinating;   Skin: No rashes  Psychological: No history of anxiety,  No history of depression  Physical Examination  Vitals:   08/17/20 1131  BP: (!) 113/58  Pulse: 60  Resp: 20  Temp: 97.9 F (36.6 C)  TempSrc: Temporal  SpO2: 99%  Weight: 121 lb 3.2 oz (55 kg)  Height: 5\' 7"  (1.702 m)    Body mass index is 18.98 kg/m.  General:  Alert and oriented, no acute distress HEENT: Normal Neck: No bruit or JVD Pulmonary: Clear to auscultation bilaterally Cardiac: Regular Rate and Rhythm without murmur Abdomen: Soft, non-tender, non-distended, no mass, no scars Skin: No rash Extremity Pulses:  2+ radial, brachial, femoral, dorsalis pedis, posterior tibial pulses bilaterally Musculoskeletal: No deformity or edema  Neurologic: Upper and lower extremity motor 5/5 and symmetric  DATA:           Venous Reflux Times  +-----------------+---------+------+-----------+------------+--------------  ----+  RIGHT      Reflux NoRefluxReflux TimeDiameter cmsComments                       Yes                         +-----------------+---------+------+-----------+------------+--------------  ----+  CFV             yes  >1 second                   +-----------------+---------+------+-----------+------------+--------------  ----+  FV mid      no                               +-----------------+---------+------+-----------+------------+--------------  ----+  Popliteal    no                               +-----------------+---------+------+-----------+------------+--------------  ----+  GSV at [facility]    no               0.64              +-----------------+---------+------+-----------+------------+--------------  ----+    GSV prox thigh  no               0.33              +-----------------+---------+------+-----------+------------+--------------  ----+  GSV mid thigh  no               0.39  branch  travels  medial to                                    posterior  thigh    +-----------------+---------+------+-----------+------------+--------------  ----+  GSV dist thigh        yes  >500 ms   0.38              +-----------------+---------+------+-----------+------------+--------------  ----+  GSV at knee   no               0.36              +-----------------+---------+------+-----------+------------+--------------  ----+  SSV Pop Fossa  no               0.25  thigh  extension                                 with no  connection                               to popliteal      +-----------------+---------+------+-----------+------------+--------------  ----+  anterior     no               0.36              accessory                                    +-----------------+---------+------+-----------+------------+--------------  ----+  thigh extension       yes  >500 ms   0.36  mid posterior                                  thigh         +-----------------+---------+------+-----------+------------+--------------  ----+         Summary:  Right:  - No evidence of deep vein thrombosis from the common femoral through the  popliteal veins.  - No evidence of superficial venous thrombosis.  - The great saphenous vein is not competent in the  distal thigh.  - The common femoral vein is not competent.  - The thigh extension is not competent in the posterior mid thigh.  - The small saphenous vein is competent.    Assessment: Mixed reflux both deep and GSV without vein size of . 0.4.  Reflux great saphenous vein  in the distal thigh Reflux  common femoral vein Varicose vein  posterior mid thigh   Plan:  She will continue to be active on a daily basis.  Avoid prolonged sitting and standing.  She will wear compression as needed for prolonged travel.  No intervention is needed with mixed reflux, no history of skin changes or non healing wounds.  She denise edema in B LE.    She will f/u as needed in the future.  She is not at risk of limb loss and palpable pedal pulses B LE.    Roxy Horseman PA-C [facility] [place] Office: [phone]  MD in clinic [place]

## 2020-10-18 ENCOUNTER — Other Ambulatory Visit: Payer: Self-pay

## 2020-10-18 ENCOUNTER — Telehealth: Payer: Self-pay | Admitting: Internal Medicine

## 2020-10-18 ENCOUNTER — Ambulatory Visit (INDEPENDENT_AMBULATORY_CARE_PROVIDER_SITE_OTHER): Payer: PPO | Admitting: Internal Medicine

## 2020-10-18 ENCOUNTER — Encounter: Payer: Self-pay | Admitting: Internal Medicine

## 2020-10-18 ENCOUNTER — Other Ambulatory Visit: Payer: Self-pay | Admitting: Internal Medicine

## 2020-10-18 VITALS — HR 100 | Temp 99.8°F

## 2020-10-18 DIAGNOSIS — H6503 Acute serous otitis media, bilateral: Secondary | ICD-10-CM | POA: Diagnosis not present

## 2020-10-18 DIAGNOSIS — H9209 Otalgia, unspecified ear: Secondary | ICD-10-CM

## 2020-10-18 DIAGNOSIS — J029 Acute pharyngitis, unspecified: Secondary | ICD-10-CM | POA: Diagnosis not present

## 2020-10-18 LAB — POCT RAPID STREP A (OFFICE): Rapid Strep A Screen: NEGATIVE

## 2020-10-18 MED ORDER — AZITHROMYCIN 250 MG PO TABS: ORAL_TABLET | ORAL | 0 refills | Status: DC

## 2020-10-18 MED FILL — AZITHROMYCIN 250 MG TABLET: 250 | 5 days supply | Qty: 6 | Fill #0

## 2020-10-18 NOTE — Patient Instructions (Addendum)
Zithromax Z-PAK 2 p.o. day 1 followed by 1 p.o. days 2 through 5.  Rest and drink plenty of fluids.  May take over-the-counter decongestant if needed for ear discomfort.  COVID-19 and respiratory virus panel tests are pending

## 2020-10-18 NOTE — Telephone Encounter (Signed)
Regina Merritt [phone]  [name] called to say her head is stopped up, nose drainage, slight sore throat, headache, this all started yesterday morning. Has had COVID-19 vaccines, No COVID-19 exposure that she knows of.

## 2020-10-18 NOTE — Progress Notes (Signed)
   Subjective:    Patient ID: Regina Merritt, female    DOB: 1951-05-12, 69 y.o.   MRN: [id number]  HPI Patient called today complaining of head congestion, nasal congestion, and headache.  Had onset this morning.  Ears feel full.  No known COVID-19 exposure.  Reports having COVID-19 vaccinations.  Her general health is excellent.  She has a history of migraine headaches that went away when she became menopausal.  She is allergic to penicillin, intolerant of Keflex.  History of osteopenia.  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  Temperature 99.8 degrees pulse oximetry 98% pulse 100  Left TM is slightly dull.  Right TM is full.  Rapid strep screen is negative.  Pharynx very slightly injected.  Neck is supple.  Chest clear to auscultation without rales or wheezing.       Assessment & Plan:  Bilateral serous otitis media  Pharyngitis-rapid strep screen is negative  Plan: Respiratory virus panel obtained, COVID-19 testing via nasal swab.  Rapid strep screen is negative.  Call in Zithromax Z-PAK 2 p.o. day 1 followed by 1 p.o. days 2 through 5.  Stay at home and quarantine until results are back from testing done today.  Rest and drink plenty of fluids.  May take decongestant for ear discomfort.

## 2020-10-18 NOTE — Telephone Encounter (Signed)
Car visit

## 2020-10-18 NOTE — Telephone Encounter (Signed)
Scheduled

## 2020-10-19 LAB — RESPIRATORY VIRUS PANEL

## 2020-10-19 LAB — SARS-COV-2 RNA,(COVID-19) QUALITATIVE NAAT: SARS CoV2 RNA: NOT DETECTED

## 2020-10-26 ENCOUNTER — Encounter: Payer: Self-pay | Admitting: Internal Medicine

## 2020-10-26 ENCOUNTER — Other Ambulatory Visit: Payer: Self-pay

## 2020-10-26 ENCOUNTER — Ambulatory Visit (INDEPENDENT_AMBULATORY_CARE_PROVIDER_SITE_OTHER): Payer: PPO | Admitting: Internal Medicine

## 2020-10-26 VITALS — BP 110/60 | HR 74 | Ht 65.75 in | Wt 120.0 lb

## 2020-10-26 DIAGNOSIS — E7800 Pure hypercholesterolemia, unspecified: Secondary | ICD-10-CM | POA: Diagnosis not present

## 2020-10-26 DIAGNOSIS — I8390 Asymptomatic varicose veins of unspecified lower extremity: Secondary | ICD-10-CM | POA: Diagnosis not present

## 2020-10-26 DIAGNOSIS — Z23 Encounter for immunization: Secondary | ICD-10-CM

## 2020-10-26 DIAGNOSIS — Z0000 Encounter for general adult medical examination without abnormal findings: Secondary | ICD-10-CM

## 2020-10-26 DIAGNOSIS — M8580 Other specified disorders of bone density and structure, unspecified site: Secondary | ICD-10-CM

## 2020-10-26 LAB — POCT URINALYSIS DIPSTICK
Appearance: NEGATIVE
Bilirubin, UA: NEGATIVE
Blood, UA: NEGATIVE
Glucose, UA: NEGATIVE
Ketones, UA: NEGATIVE
Leukocytes, UA: NEGATIVE
Nitrite, UA: NEGATIVE
Odor: NEGATIVE
Protein, UA: NEGATIVE
Spec Grav, UA: 1.01 (ref 1.010–1.025)
Urobilinogen, UA: 0.2 E.U./dL
pH, UA: 6.5 (ref 5.0–8.0)

## 2020-10-26 LAB — CBC WITH DIFFERENTIAL/PLATELET
Basophils Relative: 0.6 %
MCH: 29.7 pg (ref 27.0–33.0)
Total Lymphocyte: 16.5 %

## 2020-10-26 NOTE — Progress Notes (Signed)
Subjective:    Patient ID: Regina Merritt, female    DOB: June 08, 1951, 69 y.o.   MRN: [id number]  HPI  69 year old Female seen for health maintenance exam.  Her general health is excellent.  She has a remote history of migraine headaches that went away when she became menopausal.  History of hand arthritis diagnosed by Dr. Ouida Sills in 2013 treated with methotrexate.  She had a positive ANA with speckled pattern and a negative CCP.  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  Currently not on methotrexate.  Past medical history: She is allergic to penicillin, causes hives.  Intolerant of Keflex as well.  History of osteopenia.  Became menopausal around 2005.  History of probable erythema multiforme in January 1992.  Had depression in 1998.  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  Had bilateral tubal ligation 1989.  She took Actonel for time for osteopenia.  Has not had a recent bone density study.  She saw Dr. 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  Was felt to be a good candidate for laser ablation and stab phlebectomy.  History of onychomycosis treated with Sporanox 1999.  She had welcome to Medicare exam and September 2020.  Order placed for Cologuard in September 2020.  Patient apparently never sent sample in.  Reminded patient about bone density study and mammogram.  Social history: Been married twice.  2 children.  Non-smoker.  No alcohol consumption.  Has retired as a Environmental education officer at [employer].  Family history: 1 brother.  Father with history of hypertension.  Labs reviewed and she has an elevated LDL of 131 with total cholesterol 203.  Triglycerides are normal at 80 and HDL is 55.  Creatinine is stable at 1.03.  CBC is normal.  Dipstick urine is normal.  Liver functions are normal.  Review of Systems no new complaints     Objective:   Physical Exam Blood  pressure 110/60 pulse 74 pulse oximetry 98% weight 120 pounds height 5 feet 5 3/4 in. BMI 19.52.  Skin warm and dry.  Nodes none.  TMs are clear.  Pharynx is clear.  Neck is supple without JVD thyromegaly or carotid bruits.  Chest clear to auscultation.  Cardiac exam regular rate and rhythm normal S1 and S2 without murmurs or gallops.  Abdomen soft nondistended without hepatosplenomegaly masses or tenderness.  Bimanual is normal.  Pap deferred due to age.  No lower extremity pitting edema.  Neuro intact without focal deficits.  Affect thought and judgment are normal.      Assessment & Plan:  History of seronegative rheumatoid arthritis previously treated with methotrexate.  Currently not on any medications to my knowledge.  Health maintenance-previously has declined mammogram and colonoscopy.  Ordered Cologuard last year but apparently it was not completed.  He has had 2 Covid vaccines and should get booster.  Has had pneumococcal 23 and Prevnar 13 vaccines.  History of plantar fasciitis left foot seen in May by Dr. Milinda Pointer, podiatrist  Plan: Return in 1 year or as needed.  Subjective:   Patient presents for Medicare Annual/Subsequent preventive examination.  Review Past Medical/Family/Social: See above   Risk Factors  Current exercise habits: Yoga and walking Dietary issues discussed: Low-fat low carbohydrate  Cardiac risk factors: No significant factors noted  Depression Screen  (Note: if answer to either of the following is "Yes", a more complete depression screening is  indicated)   Over the past two weeks, have you felt down, depressed or hopeless? No  Over the past two weeks, have you felt little interest or pleasure in doing things? No Have you lost interest or pleasure in daily life? No Do you often feel hopeless? No Do you cry easily over simple problems? No   Activities of Daily Living  In your present state of health, do you have any difficulty performing the following  activities?:   Driving? No  Managing money? No  Feeding yourself? No  Getting from bed to chair? No  Climbing a flight of stairs? No  Preparing food and eating?: No  Bathing or showering? No  Getting dressed: No  Getting to the toilet? No  Using the toilet:No  Moving around from place to place: No  In the past year have you fallen or had a near fall?:No  Are you sexually active? No  Do you have more than one partner? No   Hearing Difficulties: No  Do you often ask people to speak up or repeat themselves? No  Do you experience ringing or noises in your ears? No  Do you have difficulty understanding soft or whispered voices? No  Do you feel that you have a problem with memory? No Do you often misplace items? No    Home Safety:  Do you have a smoke alarm at your residence? Yes Do you have grab bars in the bathroom?  Yes Do you have throw rugs in your house?  Yes   Cognitive Testing  Alert? Yes Normal Appearance?Yes  Oriented to person? Yes Place? Yes  Time? Yes  Recall of three objects? Yes  Can perform simple calculations? Yes  Displays appropriate judgment?Yes  Can read the correct time from a watch face?Yes   List the Names of Other Physician/Practitioners you currently use:  See referral list for the physicians patient is currently seeing.     Review of Systems: See above   Objective:     General appearance: Appears younger than stated age Head: Normocephalic, without obvious abnormality, atraumatic  Eyes: conj clear, EOMi PEERLA  Ears: normal TM's and external ear canals both ears  Nose: Nares normal. Septum midline. Mucosa normal. No drainage or sinus tenderness.  Throat: lips, mucosa, and tongue normal; teeth and gums normal  Neck: no adenopathy, no carotid bruit, no JVD, supple, symmetrical, trachea midline and thyroid not enlarged, symmetric, no tenderness/mass/nodules  No CVA tenderness.  Lungs: clear to auscultation bilaterally  Breasts: normal  appearance, no masses or tenderness.Marland Kitchen  Heart: regular rate and rhythm, S1, S2 normal, no murmur, click, rub or gallop  Abdomen: soft, non-tender; bowel sounds normal; no masses, no organomegaly  Musculoskeletal: ROM normal in all joints, no crepitus, no deformity, Normal muscle strengthen. Back  is symmetric, no curvature. Skin: Skin color, texture, turgor normal. No rashes or lesions  Lymph nodes: Cervical, supraclavicular, and axillary nodes normal.  Neurologic: CN 2 -12 Normal, Normal symmetric reflexes. Normal coordination and gait  Psych: Alert & Oriented x 3, Mood appear stable.    Assessment:    Annual wellness medicare exam   Plan:    During the course of the visit the patient was educated and counseled about appropriate screening and preventive services including:   Recommend third Covid vaccine.     Patient Instructions (the written plan) was given to the patient.  Medicare Attestation  I have personally reviewed:  The patient's medical and social history  Their use of alcohol,  tobacco or illicit drugs  Their current medications and supplements  The patient's functional ability including ADLs,fall risks, home safety risks, cognitive, and hearing and visual impairment  Diet and physical activities  Evidence for depression or mood disorders  The patient's weight, height, BMI, and visual acuity have been recorded in the chart. I have made referrals, counseling, and provided education to the patient based on review of the above and I have provided the patient with a written personalized care plan for preventive services.

## 2020-10-27 LAB — CBC WITH DIFFERENTIAL/PLATELET
Absolute Monocytes: 583 cells/uL (ref 200–950)
Basophils Absolute: 37 cells/uL (ref 0–200)
Eosinophils Absolute: 68 cells/uL (ref 15–500)
Eosinophils Relative: 1.1 %
HCT: 40.4 % (ref 35.0–45.0)
Hemoglobin: 13.5 g/dL (ref 11.7–15.5)
Lymphs Abs: 1023 cells/uL (ref 850–3900)
MCHC: 33.4 g/dL (ref 32.0–36.0)
MCV: 88.8 fL (ref 80.0–100.0)
MPV: 10.4 fL (ref 7.5–12.5)
Monocytes Relative: 9.4 %
Neutro Abs: 4489 cells/uL (ref 1500–7800)
Neutrophils Relative %: 72.4 %
Platelets: 300 10*3/uL (ref 140–400)
RBC: 4.55 10*6/uL (ref 3.80–5.10)
RDW: 11.8 % (ref 11.0–15.0)
WBC: 6.2 10*3/uL (ref 3.8–10.8)

## 2020-10-27 LAB — COMPLETE METABOLIC PANEL WITH GFR
AG Ratio: 1.5 (calc) (ref 1.0–2.5)
ALT: 7 U/L (ref 6–29)
AST: 15 U/L (ref 10–35)
Albumin: 4 g/dL (ref 3.6–5.1)
Alkaline phosphatase (APISO): 60 U/L (ref 37–153)
BUN/Creatinine Ratio: 22 (calc) (ref 6–22)
BUN: 23 mg/dL (ref 7–25)
CO2: 30 mmol/L (ref 20–32)
Calcium: 9.6 mg/dL (ref 8.6–10.4)
Chloride: 101 mmol/L (ref 98–110)
Creat: 1.03 mg/dL — ABNORMAL HIGH (ref 0.50–0.99)
GFR, Est African American: 64 mL/min/{1.73_m2} (ref >=60)
GFR, Est Non African American: 55 mL/min/{1.73_m2} — ABNORMAL LOW (ref >=60)
Globulin: 2.7 g/dL (calc) (ref 1.9–3.7)
Glucose, Bld: 91 mg/dL (ref 65–99)
Potassium: 4.6 mmol/L (ref 3.5–5.3)
Sodium: 138 mmol/L (ref 135–146)
Total Bilirubin: 0.4 mg/dL (ref 0.2–1.2)
Total Protein: 6.7 g/dL (ref 6.1–8.1)

## 2020-10-27 LAB — LIPID PANEL
Cholesterol: 203 mg/dL — ABNORMAL HIGH (ref <200)
HDL: 55 mg/dL (ref >=50)
LDL Cholesterol (Calc): 131 mg/dL (calc) — ABNORMAL HIGH
Non-HDL Cholesterol (Calc): 148 mg/dL (calc) — ABNORMAL HIGH (ref <130)
Total CHOL/HDL Ratio: 3.7 (calc) (ref <5.0)
Triglycerides: 80 mg/dL (ref <150)

## 2020-10-27 LAB — TSH: TSH: 1.92 mIU/L (ref 0.40–4.50)

## 2020-11-16 NOTE — Patient Instructions (Addendum)
It was a pleasure to see you today.  Labs are stable.  Please have Covid booster.  Pneumococcal 23 vaccine given today.  Return in 1 year or as needed.

## 2021-07-19 ENCOUNTER — Other Ambulatory Visit (HOSPITAL_COMMUNITY): Payer: Self-pay

## 2021-09-08 ENCOUNTER — Telehealth: Payer: Self-pay | Admitting: Internal Medicine

## 2021-09-08 ENCOUNTER — Other Ambulatory Visit: Payer: Self-pay

## 2021-09-08 ENCOUNTER — Other Ambulatory Visit (HOSPITAL_COMMUNITY): Payer: Self-pay

## 2021-09-08 ENCOUNTER — Ambulatory Visit (INDEPENDENT_AMBULATORY_CARE_PROVIDER_SITE_OTHER): Payer: PPO | Admitting: Internal Medicine

## 2021-09-08 ENCOUNTER — Encounter: Payer: Self-pay | Admitting: Internal Medicine

## 2021-09-08 VITALS — BP 126/70 | HR 82 | Temp 98.2°F

## 2021-09-08 DIAGNOSIS — J0100 Acute maxillary sinusitis, unspecified: Secondary | ICD-10-CM | POA: Diagnosis not present

## 2021-09-08 DIAGNOSIS — R051 Acute cough: Secondary | ICD-10-CM | POA: Diagnosis not present

## 2021-09-08 DIAGNOSIS — Z1211 Encounter for screening for malignant neoplasm of colon: Secondary | ICD-10-CM

## 2021-09-08 DIAGNOSIS — Z1213 Encounter for screening for malignant neoplasm of small intestine: Secondary | ICD-10-CM

## 2021-09-08 MED ORDER — AZITHROMYCIN 250 MG PO TABS
ORAL_TABLET | ORAL | 1 refills | Status: AC
  Filled 2021-09-08: qty 6, 5d supply, fill #0
  Filled 2021-09-12: qty 6, 5d supply, fill #1

## 2021-09-08 NOTE — Patient Instructions (Addendum)
You likely have maxillary sinusitis or lower respiratory infection as a result of COVID-19 virus infection.  Have prescribed Zithromax Z-PAK 2 tablets day 1 followed by 1 tablet days 2 through 5.  Please call if not better in 10 days to 2 weeks.

## 2021-09-08 NOTE — Progress Notes (Signed)
   Subjective:    Patient ID: Regina Merritt, female    DOB: [birth year], 70 y.o.   MRN: [id number]  HPI 70 year old Female had Covid-19 about 2 weeks ago. Had a lot of fatigue and slept a lot.. Has nasal congestion. Ears are hurting. Throat irritation from post nasal drip. Slight green nasal discharge.   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  Has had 2 Covid vaccines one of which she says resulted  in a facial rash.    Review of Systems no dysgeusia, nausea or vomiting     Objective:   Physical Exam T 98.2 degrees ,pulse 82 regular, BP 126/70 Pulse ox 98%  She is in no acute distress and respiratory rate is normal. TMs are clear.  Pharynx is clear.  Neck is supple.  Chest is clear to auscultation without rales or wheezing.       Assessment & Plan:  History of acute COVID-19 with protracted cough and postnasal drip.  May have secondary bacterial infection/sinusitis versus bronchitis  Plan: Zithromax Z-PAK 2 tablets day 1 followed by 1 tablet days 2 through 5.  Call if not better in 10 days or sooner if worse.  Cologuard ordered for colon cancer screening.  Plan:

## 2021-09-08 NOTE — Telephone Encounter (Signed)
Regina Merritt [phone]  [name] called to say she had COVID 2 weeks ago today and her head is stopped up, she says it has all moved around.

## 2021-09-08 NOTE — Telephone Encounter (Signed)
scheduled

## 2021-09-12 ENCOUNTER — Other Ambulatory Visit (HOSPITAL_COMMUNITY): Payer: Self-pay

## 2021-09-12 NOTE — Telephone Encounter (Signed)
Called the pharmacy and they are getting the prescription ready for patient, and then I called the patient and let her know they were getting it ready. She verbalized understanding.

## 2021-09-12 NOTE — Telephone Encounter (Signed)
Regina Merritt called to say when she was here on Friday that you had said that you would give her a refill on the Z-pack.  [name] Phone:  [phone]  Fax:  [phone]

## 2021-10-06 DIAGNOSIS — H35373 Puckering of macula, bilateral: Secondary | ICD-10-CM | POA: Diagnosis not present

## 2021-10-06 DIAGNOSIS — H43812 Vitreous degeneration, left eye: Secondary | ICD-10-CM | POA: Diagnosis not present

## 2021-10-06 DIAGNOSIS — H4312 Vitreous hemorrhage, left eye: Secondary | ICD-10-CM | POA: Diagnosis not present

## 2021-10-06 DIAGNOSIS — H43821 Vitreomacular adhesion, right eye: Secondary | ICD-10-CM | POA: Diagnosis not present

## 2021-10-16 DIAGNOSIS — H43821 Vitreomacular adhesion, right eye: Secondary | ICD-10-CM | POA: Diagnosis not present

## 2021-10-16 DIAGNOSIS — H35373 Puckering of macula, bilateral: Secondary | ICD-10-CM | POA: Diagnosis not present

## 2021-10-16 DIAGNOSIS — H4312 Vitreous hemorrhage, left eye: Secondary | ICD-10-CM | POA: Diagnosis not present

## 2021-10-16 DIAGNOSIS — H43812 Vitreous degeneration, left eye: Secondary | ICD-10-CM | POA: Diagnosis not present

## 2021-11-13 DIAGNOSIS — H43812 Vitreous degeneration, left eye: Secondary | ICD-10-CM | POA: Diagnosis not present

## 2021-11-13 DIAGNOSIS — H4312 Vitreous hemorrhage, left eye: Secondary | ICD-10-CM | POA: Diagnosis not present

## 2021-11-13 DIAGNOSIS — H35372 Puckering of macula, left eye: Secondary | ICD-10-CM | POA: Diagnosis not present

## 2021-11-13 DIAGNOSIS — H43392 Other vitreous opacities, left eye: Secondary | ICD-10-CM | POA: Diagnosis not present

## 2021-12-07 ENCOUNTER — Other Ambulatory Visit: Payer: Self-pay

## 2021-12-07 DIAGNOSIS — Z1213 Encounter for screening for malignant neoplasm of small intestine: Secondary | ICD-10-CM

## 2021-12-07 DIAGNOSIS — Z1211 Encounter for screening for malignant neoplasm of colon: Secondary | ICD-10-CM

## 2021-12-18 ENCOUNTER — Other Ambulatory Visit: Payer: PPO | Admitting: Internal Medicine

## 2021-12-18 ENCOUNTER — Other Ambulatory Visit: Payer: Self-pay

## 2021-12-18 DIAGNOSIS — Z0000 Encounter for general adult medical examination without abnormal findings: Secondary | ICD-10-CM

## 2021-12-18 DIAGNOSIS — E7800 Pure hypercholesterolemia, unspecified: Secondary | ICD-10-CM | POA: Diagnosis not present

## 2021-12-18 DIAGNOSIS — Z1329 Encounter for screening for other suspected endocrine disorder: Secondary | ICD-10-CM | POA: Diagnosis not present

## 2021-12-18 LAB — COMPLETE METABOLIC PANEL WITH GFR
AG Ratio: 1.6 (calc) (ref 1.0–2.5)
ALT: 9 U/L (ref 6–29)
AST: 15 U/L (ref 10–35)
Albumin: 4.1 g/dL (ref 3.6–5.1)
Alkaline phosphatase (APISO): 55 U/L (ref 37–153)
BUN/Creatinine Ratio: 17 (calc) (ref 6–22)
BUN: 17 mg/dL (ref 7–25)
CO2: 32 mmol/L (ref 20–32)
Calcium: 9.5 mg/dL (ref 8.6–10.4)
Chloride: 103 mmol/L (ref 98–110)
Creat: 1.01 mg/dL — ABNORMAL HIGH (ref 0.60–1.00)
Globulin: 2.6 g/dL (calc) (ref 1.9–3.7)
Glucose, Bld: 85 mg/dL (ref 65–99)
Potassium: 4.6 mmol/L (ref 3.5–5.3)
Sodium: 138 mmol/L (ref 135–146)
Total Bilirubin: 0.6 mg/dL (ref 0.2–1.2)
Total Protein: 6.7 g/dL (ref 6.1–8.1)
eGFR: 60 mL/min/{1.73_m2} (ref >=60)

## 2021-12-18 LAB — CBC WITH DIFFERENTIAL/PLATELET
Absolute Monocytes: 531 cells/uL (ref 200–950)
Basophils Absolute: 18 cells/uL (ref 0–200)
Basophils Relative: 0.4 %
Eosinophils Absolute: 41 cells/uL (ref 15–500)
Eosinophils Relative: 0.9 %
HCT: 39.9 % (ref 35.0–45.0)
Hemoglobin: 13.3 g/dL (ref 11.7–15.5)
Lymphs Abs: 1112 cells/uL (ref 850–3900)
MCH: 29.2 pg (ref 27.0–33.0)
MCHC: 33.3 g/dL (ref 32.0–36.0)
MCV: 87.5 fL (ref 80.0–100.0)
MPV: 10.7 fL (ref 7.5–12.5)
Monocytes Relative: 11.8 %
Neutro Abs: 2799 cells/uL (ref 1500–7800)
Neutrophils Relative %: 62.2 %
Platelets: 225 10*3/uL (ref 140–400)
RBC: 4.56 10*6/uL (ref 3.80–5.10)
RDW: 12.9 % (ref 11.0–15.0)
Total Lymphocyte: 24.7 %
WBC: 4.5 10*3/uL (ref 3.8–10.8)

## 2021-12-18 LAB — LIPID PANEL
Cholesterol: 224 mg/dL — ABNORMAL HIGH (ref <200)
HDL: 67 mg/dL (ref >=50)
LDL Cholesterol (Calc): 140 mg/dL (calc) — ABNORMAL HIGH
Non-HDL Cholesterol (Calc): 157 mg/dL (calc) — ABNORMAL HIGH (ref <130)
Total CHOL/HDL Ratio: 3.3 (calc) (ref <5.0)
Triglycerides: 71 mg/dL (ref <150)

## 2021-12-18 LAB — TSH: TSH: 2.82 mIU/L (ref 0.40–4.50)

## 2021-12-26 ENCOUNTER — Encounter: Payer: Self-pay | Admitting: Internal Medicine

## 2021-12-26 ENCOUNTER — Other Ambulatory Visit: Payer: Self-pay

## 2021-12-26 ENCOUNTER — Ambulatory Visit (INDEPENDENT_AMBULATORY_CARE_PROVIDER_SITE_OTHER): Payer: PPO | Admitting: Internal Medicine

## 2021-12-26 VITALS — BP 98/60 | HR 72 | Temp 98.1°F | Ht 66.25 in | Wt 124.2 lb

## 2021-12-26 DIAGNOSIS — Z0000 Encounter for general adult medical examination without abnormal findings: Secondary | ICD-10-CM | POA: Diagnosis not present

## 2021-12-26 DIAGNOSIS — Z1211 Encounter for screening for malignant neoplasm of colon: Secondary | ICD-10-CM | POA: Diagnosis not present

## 2021-12-26 DIAGNOSIS — M8580 Other specified disorders of bone density and structure, unspecified site: Secondary | ICD-10-CM

## 2021-12-26 DIAGNOSIS — Z1213 Encounter for screening for malignant neoplasm of small intestine: Secondary | ICD-10-CM

## 2021-12-26 DIAGNOSIS — N951 Menopausal and female climacteric states: Secondary | ICD-10-CM

## 2021-12-26 DIAGNOSIS — E7800 Pure hypercholesterolemia, unspecified: Secondary | ICD-10-CM | POA: Diagnosis not present

## 2021-12-26 LAB — POCT URINALYSIS DIPSTICK
Bilirubin, UA: NEGATIVE
Blood, UA: NEGATIVE
Glucose, UA: NEGATIVE
Ketones, UA: NEGATIVE
Leukocytes, UA: NEGATIVE
Nitrite, UA: NEGATIVE
Protein, UA: NEGATIVE
Spec Grav, UA: 1.02 (ref 1.010–1.025)
Urobilinogen, UA: 0.2 E.U./dL
pH, UA: 5 (ref 5.0–8.0)

## 2021-12-26 NOTE — Progress Notes (Signed)
Annual Wellness Visit     Patient: Regina Merritt, Female    DOB: Jan 29, 1951, 71 y.o.   MRN: [id number] Visit Date: 12/26/2021  Chief Complaint  Patient presents with   Medicare Wellness   Subjective    Regina Merritt is a 71 y.o. female who presents today for her Annual Wellness Visit.  HPI patient also presents for health maintenance exam.  She has remote history of migraine headaches that resolved after menopause.  History of hand arthritis diagnosed by Dr. Ouida Sills in 2013 treated with methotrexate.  She had positive ANA with speckled pattern and a negative CCP.  Has been diagnosed with seronegative rheumatoid arthritis and currently is not on methotrexate.  Past medical history: She is allergic to penicillin-it causes hives.  Intolerant of Keflex.  History of osteopenia.  She took Actonel for time for osteopenia.  Bone density study and mammogram ordered today to be done later this year.  Became menopausal around 2005.  Had probable erythema multiforme in January 1992.  Had depression in 1998.  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  Had bilateral tubal ligation 1989.  She saw Dr. 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  Was felt to be a good candidate for laser ablation and stab phlebectomy.  History of onychomycosis treated with Sporanox 1999.  Order placed once again for Cologuard.  She did not do it last year.  Have ordered mammogram and bone density study today as well.  Social history: Has been married twice.  2 children.  Non-smoker.  Does not consume alcohol.  Is retired as a Environmental education officer at [employer].  Family history: 1 brother.  Father with history of hypertension.  Total cholesterol is 224 with LDL cholesterol of 140.  A year ago total cholesterol was 203 with an LDL of 131.  She does not want to be on statin medication.  She will continue to work on diet and exercise.  2 years ago her LDL was  105.  Total cholesterol at that time was 173.  Immunizations discussed.  Declines flu vaccine.  Has had 2 COVID vaccines in 2021 and has not had COVID-19.  He has had pneumococcal 13 and Prevnar 23 vaccines.  Needs tetanus immunization update but Medicare will not pay for it unless she is injured.  She is aware of this.  Has not had Shingrix vaccines and should consider that.  Cologuard order placed and patient was given phone number to call the company if necessary to complete shipment  Social History   Social History Narrative       Social history: Been married twice.  2 children.  Non-smoker.  No alcohol consumption.  Has retired as a Environmental education officer at [employer].       Family history: 1 brother.  Father with history of hypertension.        Patient Care Team: Elby Showers, MD as PCP - General (Internal Medicine)  Review of Systems   Objective    Vitals: BP 98/60    Pulse 72    Temp 98.1 F (36.7 C) (Tympanic)    Ht 5' 6.25" (1.683 m)    Wt 124 lb 4 oz (56.4 kg)    SpO2 97%    BMI 19.90 kg/m   Physical Exam  Skin warm and dry.  No cervical adenopathy.  No thyromegaly.  No carotid bruits.  Chest is clear to auscultation.  Cardiac exam: Regular rate and rhythm without ectopy.  Abdomen is soft nondistended without hepatosplenomegaly masses or tenderness.  Bimanual exam is normal.  Pap deferred due to age.  No lower extremity pitting edema.  Neurological exam is intact without focal deficits.  Affect thought and judgment are normal.   Most recent functional status assessment: In your present state of health, do you have any difficulty performing the following activities: 12/26/2021  Hearing? N  Vision? N  Difficulty concentrating or making decisions? N  Walking or climbing stairs? N  Dressing or bathing? N  Doing errands, shopping? N  Preparing Food and eating ? N  Using the Toilet? N  In the past six months, have you accidently leaked urine? N  Do you have problems with  loss of bowel control? N  Managing your Medications? N  Managing your Finances? N  Housekeeping or managing your Housekeeping? N  Some recent data might be hidden   Most recent fall risk assessment: Fall Risk  12/26/2021  Falls in the past year? 0  Comment -  Number falls in past yr: 0  Injury with Fall? 0  Risk for fall due to : No Fall Risks  Follow up Falls evaluation completed    Most recent depression screenings: PHQ 2/9 Scores 12/26/2021 10/26/2020  PHQ - 2 Score 0 0   Most recent cognitive screening: 6CIT Screen 12/26/2021  What Year? 0 points  What month? 0 points  What time? 0 points  Count back from 20 0 points  Months in reverse 0 points  Repeat phrase 0 points  Total Score 0       Assessment & Plan   Normal health maintenance exam  Pure hypercholesterolemia-does not want to be on statin medication.  Continue to work on diet and exercise.  Health maintenance-Cologuard ordered.  Patient does not want colonoscopy.  Mammogram and bone density studies ordered for future.  Vaccines discussed  Plan: Continue to work on diet and exercise.  Should consider medication if persistent hyperlipidemia.  She is not overweight and unlikely to normalize lipids unless she changes her diet quite a bit.  Return in 1 year or as needed.  Advised to do Cologuard as she does not want to do colonoscopy.  Mammogram and bone density study ordered.    Annual wellness visit done today including the all of the following: Reviewed patient's Family Medical History Reviewed and updated list of patient's medical providers Assessment of cognitive impairment was done Assessed patient's functional ability Established a written schedule for health screening [place] Completed and Reviewed  Discussed health benefits of physical activity, and encouraged her to engage in regular exercise appropriate for her age and condition.         {I, Elby Showers, MD, have reviewed  all documentation for this visit. The documentation on 12/26/21 for the exam, diagnosis, procedures, and orders are all accurate and complete.   Angus Seller, CMA

## 2021-12-26 NOTE — Patient Instructions (Signed)
It was a pleasure to see you today.  Please do Cologuard testing.  Have ordered mammogram and bone density study as well.  Work on diet and exercise due to hyperlipidemia.  Return in 1 year or as needed.

## 2021-12-28 NOTE — Addendum Note (Signed)
Addended by: Angus Seller on: 12/28/2021 10:02 AM   Modules accepted: Orders

## 2022-01-08 ENCOUNTER — Ambulatory Visit
Admission: RE | Admit: 2022-01-08 | Discharge: 2022-01-08 | Disposition: A | Discharge status: Home / Self Care | Payer: PPO | Source: Ambulatory Visit | Attending: Internal Medicine | Admitting: Internal Medicine

## 2022-01-08 ENCOUNTER — Other Ambulatory Visit: Payer: Self-pay

## 2022-01-08 DIAGNOSIS — Z1231 Encounter for screening mammogram for malignant neoplasm of breast: Secondary | ICD-10-CM | POA: Diagnosis not present

## 2022-01-10 ENCOUNTER — Other Ambulatory Visit: Payer: Self-pay | Admitting: Internal Medicine

## 2022-01-10 DIAGNOSIS — R928 Other abnormal and inconclusive findings on diagnostic imaging of breast: Secondary | ICD-10-CM

## 2022-01-29 ENCOUNTER — Other Ambulatory Visit: Payer: Self-pay | Admitting: Internal Medicine

## 2022-01-29 ENCOUNTER — Other Ambulatory Visit: Payer: Self-pay

## 2022-01-29 ENCOUNTER — Ambulatory Visit
Admission: RE | Admit: 2022-01-29 | Discharge: 2022-01-29 | Disposition: A | Discharge status: Home / Self Care | Payer: PPO | Source: Ambulatory Visit | Attending: Internal Medicine | Admitting: Internal Medicine

## 2022-01-29 DIAGNOSIS — H43812 Vitreous degeneration, left eye: Secondary | ICD-10-CM | POA: Diagnosis not present

## 2022-01-29 DIAGNOSIS — R928 Other abnormal and inconclusive findings on diagnostic imaging of breast: Secondary | ICD-10-CM

## 2022-01-29 DIAGNOSIS — H35373 Puckering of macula, bilateral: Secondary | ICD-10-CM | POA: Diagnosis not present

## 2022-01-29 DIAGNOSIS — N6489 Other specified disorders of breast: Secondary | ICD-10-CM | POA: Diagnosis not present

## 2022-01-29 DIAGNOSIS — H4312 Vitreous hemorrhage, left eye: Secondary | ICD-10-CM | POA: Diagnosis not present

## 2022-01-29 DIAGNOSIS — H43392 Other vitreous opacities, left eye: Secondary | ICD-10-CM | POA: Diagnosis not present

## 2022-02-06 ENCOUNTER — Ambulatory Visit
Admission: RE | Admit: 2022-02-06 | Discharge: 2022-02-06 | Disposition: A | Discharge status: Home / Self Care | Payer: PPO | Source: Ambulatory Visit | Attending: Internal Medicine | Admitting: Internal Medicine

## 2022-02-06 DIAGNOSIS — R928 Other abnormal and inconclusive findings on diagnostic imaging of breast: Secondary | ICD-10-CM

## 2022-02-06 DIAGNOSIS — D242 Benign neoplasm of left breast: Secondary | ICD-10-CM | POA: Diagnosis not present

## 2022-02-06 DIAGNOSIS — N6342 Unspecified lump in left breast, subareolar: Secondary | ICD-10-CM | POA: Diagnosis not present

## 2022-03-20 DIAGNOSIS — D367 Benign neoplasm of other specified sites: Secondary | ICD-10-CM | POA: Diagnosis not present

## 2022-08-10 ENCOUNTER — Other Ambulatory Visit: Payer: Self-pay | Admitting: General Surgery

## 2022-08-10 DIAGNOSIS — D369 Benign neoplasm, unspecified site: Secondary | ICD-10-CM

## 2022-09-11 ENCOUNTER — Other Ambulatory Visit: Payer: Self-pay

## 2022-09-11 DIAGNOSIS — Z1211 Encounter for screening for malignant neoplasm of colon: Secondary | ICD-10-CM

## 2022-09-11 DIAGNOSIS — Z1213 Encounter for screening for malignant neoplasm of small intestine: Secondary | ICD-10-CM

## 2022-09-11 LAB — COLOGUARD

## 2022-09-26 LAB — COLOGUARD

## 2022-09-28 ENCOUNTER — Other Ambulatory Visit: Payer: Self-pay

## 2022-09-28 DIAGNOSIS — Z1211 Encounter for screening for malignant neoplasm of colon: Secondary | ICD-10-CM

## 2022-10-10 DIAGNOSIS — Z1211 Encounter for screening for malignant neoplasm of colon: Secondary | ICD-10-CM | POA: Diagnosis not present

## 2022-10-18 LAB — COLOGUARD: COLOGUARD: NEGATIVE

## 2022-11-21 DIAGNOSIS — H35372 Puckering of macula, left eye: Secondary | ICD-10-CM | POA: Diagnosis not present

## 2022-11-21 DIAGNOSIS — H25812 Combined forms of age-related cataract, left eye: Secondary | ICD-10-CM | POA: Diagnosis not present

## 2022-12-28 DIAGNOSIS — H02403 Unspecified ptosis of bilateral eyelids: Secondary | ICD-10-CM | POA: Diagnosis not present

## 2023-01-29 NOTE — Progress Notes (Signed)
Annual Wellness Visit    Patient Care Team: Elby Showers, MD as PCP - General (Internal Medicine)  Visit Date: 02/05/23   Chief Complaint  Patient presents with   Medicare Wellness   Annual Exam    Subjective:   Patient: Regina Merritt, Female    DOB: [birth year], 72 y.o.   MRN: [id number]  Regina Merritt is a 72 y.o. Female who presents today for her Medicare Annual Wellness Visit. She is also here for health maintenance exam and evaluation of medical issues. She has a history of osteopenia,  Rheumatoid arthritis, hx of headaches,  and hx of onychomycosis.   Today, she has a  cough with clear nasal drainage. Denies fever, chills.    Glucose, electrolytes normal. Liver, kidney function normal. CHOL elevated at 220, LDL at 138 on 02/04/23, lipid panel otherwise normal. She is not interested in taking statin medication at this time.  Denies urinary symptoms.  She has remote history of migraine headaches that resolved after menopause.  History of hand arthritis diagnosed by Dr. Ouida Sills in 2013 treated with methotrexate.  She had positive ANA with speckled pattern and a negative CCP.  Has been diagnosed with seronegative rheumatoid arthritis and currently is not on methotrexate.  She is allergic to Penicillin-it causes hives.  Intolerant of Keflex.  History of osteopenia.  DEXA scan last completed 09/02/14. Results showed T-score -2.2 at right femur neck, T-score -1.4 at AP lumbar spine. Bone density study ordered.  Became menopausal around 2005.  Had probable erythema multiforme in January 1992.  Had depression in 1998.  History of Herpes simplex right buttock August 2010.  Had bilateral tubal ligation 1989.  She saw Dr. 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  Was felt to be a good candidate for laser ablation and stab phlebectomy.  History of onychomycosis treated with Sporanox 1999.  Pelvic exam deferred at pt  request.  01/29/22 [place] of left breast showed suspicious mass at 6 o'clock position. 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 Repeat study ordered by Dr. Donne Hazel as well as mammogram.This is due now.   Negative Cologuard in December 2023. Recommended repeat in 3 years.  She walks frequently with her dog.  Social history: Has been married twice.  2 children.  Non-smoker.  Does not consume alcohol.  Is retired as a Environmental education officer at [employer].  Family history: 1 brother.  Father with history of hypertension Family History  Problem Relation Age of Onset   Heart failure Father        Deceased   Breast cancer Neg Hx      Social History   Social History Narrative       Social history: Been married twice.  2 children.  Non-smoker.  No alcohol consumption.  Has retired as a Environmental education officer at [employer].       Family history: 1 brother.  Father with history of hypertension.         Review of Systems  Constitutional:  Negative for chills, fever, malaise/fatigue and weight loss.  HENT:  Negative for hearing loss, sinus pain and sore throat.        (+) Clear nasal drainage  Respiratory:  Positive for cough. Negative for hemoptysis.   Cardiovascular:  Negative for chest pain, palpitations, leg swelling and PND.  Gastrointestinal:  Negative for abdominal pain, constipation, diarrhea, heartburn, nausea and vomiting.  Genitourinary:  Negative for dysuria, frequency and urgency.  Musculoskeletal:  Negative for back pain, myalgias and neck pain.  Skin:  Negative for itching and rash.  Neurological:  Negative for dizziness, tingling, seizures and headaches.  Endo/Heme/Allergies:  Negative for polydipsia.  Psychiatric/Behavioral:  Negative for depression. The patient is not nervous/anxious.       Objective:   Vitals: BP 126/66   Pulse 88   Temp  99.7 F (37.6 C) (Tympanic)   Ht '5\' 7"'$  (1.702 m)   Wt 117 lb 12.8 oz (53.4 kg)   SpO2 99%   BMI 18.45 kg/m   Physical Exam Vitals and nursing note reviewed.  Constitutional:      General: She is not in acute distress.    Appearance: Normal appearance. She is not ill-appearing or toxic-appearing.  HENT:     Head: Normocephalic and atraumatic.     Right Ear: Hearing, tympanic membrane, ear canal and external ear normal.     Left Ear: Hearing, tympanic membrane, ear canal and external ear normal.     Mouth/Throat:     Pharynx: Oropharynx is clear.  Eyes:     Extraocular Movements: Extraocular movements intact.     Pupils: Pupils are equal, round, and reactive to light.  Neck:     Thyroid: No thyroid mass, thyromegaly or thyroid tenderness.     Vascular: No carotid bruit.  Cardiovascular:     Rate and Rhythm: Normal rate and regular rhythm. No extrasystoles are present.    Pulses: Normal pulses.          Dorsalis pedis pulses are 2+ on the right side and 2+ on the left side.       Posterior tibial pulses are 2+ on the right side and 2+ on the left side.     Heart sounds: Normal heart sounds. No murmur heard.    No friction rub. No gallop.  Pulmonary:     Effort: Pulmonary effort is normal.     Breath sounds: Normal breath sounds. No decreased breath sounds, wheezing, rhonchi or rales.  Chest:     Chest wall: No mass.  Abdominal:     Palpations: Abdomen is soft. There is no hepatomegaly, splenomegaly or mass.     Tenderness: There is no abdominal tenderness.     Hernia: No hernia is present.  Musculoskeletal:     Cervical back: Normal range of motion.     Right lower leg: No edema.     Left lower leg: No edema.  Lymphadenopathy:     Cervical: No cervical adenopathy.     Upper Body:     Right upper body: No supraclavicular adenopathy.     Left upper body: No supraclavicular adenopathy.  Skin:    General: Skin is warm and dry.  Neurological:     General: No focal deficit  present.     Mental Status: She is alert and oriented to person, place, and time. Mental status is at baseline.     Sensory: Sensation is intact.     Motor: Motor function is intact. No weakness.     Deep Tendon Reflexes: Reflexes are normal and symmetric.  Psychiatric:        Attention and Perception: Attention normal.        Mood and Affect: Mood normal.        Speech: Speech normal.        Behavior: Behavior  normal.        Thought Content: Thought content normal.        Cognition and Memory: Cognition normal.        Judgment: Judgment normal.      Most recent functional status assessment:    02/05/2023   10:20 AM  In your present state of health, do you have any difficulty performing the following activities:  Hearing? 0  Vision? 0  Difficulty concentrating or making decisions? 0  Walking or climbing stairs? 0  Dressing or bathing? 0  Doing errands, shopping? 0  Preparing Food and eating ? N  Using the Toilet? N  In the past six months, have you accidently leaked urine? N  Do you have problems with loss of bowel control? N  Managing your Medications? N  Managing your Finances? N  Housekeeping or managing your Housekeeping? N   Most recent fall risk assessment:    02/05/2023   10:05 AM  Fall Risk   Falls in the past year? 0  Number falls in past yr: 0  Injury with Fall? 0  Risk for fall due to : No Fall Risks  Follow up Falls prevention discussed    Most recent depression screenings:    02/05/2023   10:05 AM 12/26/2021   10:54 AM  PHQ 2/9 Scores  PHQ - 2 Score 0 0   Most recent cognitive screening:    02/05/2023   10:21 AM  6CIT Screen  What Year? 0 points  What time? 0 points  Count back from 20 0 points  Months in reverse 0 points  Repeat phrase 0 points     Results:   Studies obtained and personally reviewed by me:  01/29/22 [place] of left breast showed suspicious mass at 6 o'clock position. US biopsy on 02/06/22 showed INTRADUCTAL PAPILLOMA WITH  EXTENSIVE APOCRINE METAPLASIA- NO ATYPIA OR MALIGNANCY IDENTIFIED of the LEFT breast, 6 o'clock, retroareolar (ribbon clip). Repeat ordered by Dr. Donne Hazel as well as mammogram.  DEXA scan last completed 09/02/14. T-score at -2.2 at right femur neck.   Labs:       Component Value Date/Time   NA 140 02/04/2023 0926   K 4.2 02/04/2023 0926   CL 104 02/04/2023 0926   CO2 29 02/04/2023 0926   GLUCOSE 92 02/04/2023 0926   BUN 15 02/04/2023 0926   CREATININE 0.94 02/04/2023 0926   CALCIUM 9.4 02/04/2023 0926   PROT 7.0 02/04/2023 0926   ALBUMIN 4.1 09/13/2014 1011   AST 17 02/04/2023 0926   ALT 12 02/04/2023 0926   ALKPHOS 50 09/13/2014 1011   BILITOT 0.5 02/04/2023 0926   GFRNONAA 55 (L) 10/26/2020 1115   GFRAA 64 10/26/2020 1115     Lab Results  Component Value Date   WBC 6.3 02/04/2023   HGB 13.8 02/04/2023   HCT 41.7 02/04/2023   MCV 87.6 02/04/2023   PLT 202 02/04/2023    Lab Results  Component Value Date   CHOL 220 (H) 02/04/2023   HDL 64 02/04/2023   LDLCALC 138 (H) 02/04/2023   TRIG 82 02/04/2023   CHOLHDL 3.4 02/04/2023    No results found for: "HGBA1C"   Lab Results  Component Value Date   TSH 1.52 02/04/2023    Assessment & Plan:   Acute lower respiratory infection: prescribed Zithromax Z-Pak two tabs day 1 followed by one tab days 2-5.   Pure hypercholesterolemia--Elevated cholesterol: CHOL elevated at 220, LDL at 138 on 02/04/23, lipid panel otherwise normal. She  is not interested in taking statins at this time.  Osteopenia: DEXA last done 2015.Bone density ordered  Leukocytes in urine- may not be clean catch. Culture sent.  Remote hx of Rheumatoid arthritis- currently not on treatment  Hx of intraductal papilloma- being observed for now with annual mammography. Has seen Dr. Donne Hazel for consultation in April 2023 and observation agreed on with patient and Dr. Donne Hazel.  Vaccine Counseling:  Will go to pharmacy for tetanus vaccine. UTD on  flu, Covid-19, shingles, pneumonia vaccines.  Return in one year for health maintenance exam.     Annual wellness visit done today including the all of the following: Reviewed patient's Family Medical History Reviewed and updated list of patient's medical providers Assessment of cognitive impairment was done Assessed patient's functional ability Established a written schedule for health screening [place] Completed and Reviewed  Discussed health benefits of physical activity, and encouraged her to engage in regular exercise appropriate for her age and condition.        I,Alexander Ruley,acting as a Education administrator for Elby Showers, MD.,have documented all relevant documentation on the behalf of Elby Showers, MD,as directed by  Elby Showers, MD while in the presence of Elby Showers, MD.   I, Elby Showers, MD, have reviewed all documentation for this visit. The documentation on 02/05/23 for the exam, diagnosis, procedures, and orders are all accurate and complete.

## 2023-02-04 ENCOUNTER — Other Ambulatory Visit: Payer: PPO

## 2023-02-04 DIAGNOSIS — R5383 Other fatigue: Secondary | ICD-10-CM

## 2023-02-04 DIAGNOSIS — Z136 Encounter for screening for cardiovascular disorders: Secondary | ICD-10-CM

## 2023-02-04 DIAGNOSIS — E7800 Pure hypercholesterolemia, unspecified: Secondary | ICD-10-CM

## 2023-02-04 LAB — LIPID PANEL
Cholesterol: 220 mg/dL — ABNORMAL HIGH (ref <200)
HDL: 64 mg/dL (ref >=50)
LDL Cholesterol (Calc): 138 mg/dL (calc) — ABNORMAL HIGH
Non-HDL Cholesterol (Calc): 156 mg/dL (calc) — ABNORMAL HIGH (ref <130)
Total CHOL/HDL Ratio: 3.4 (calc) (ref <5.0)
Triglycerides: 82 mg/dL (ref <150)

## 2023-02-04 LAB — CBC WITH DIFFERENTIAL/PLATELET
Absolute Monocytes: 800 cells/uL (ref 200–950)
Basophils Absolute: 13 cells/uL (ref 0–200)
Basophils Relative: 0.2 %
Eosinophils Absolute: 38 cells/uL (ref 15–500)
Eosinophils Relative: 0.6 %
HCT: 41.7 % (ref 35.0–45.0)
Hemoglobin: 13.8 g/dL (ref 11.7–15.5)
Lymphs Abs: 813 cells/uL — ABNORMAL LOW (ref 850–3900)
MCH: 29 pg (ref 27.0–33.0)
MCHC: 33.1 g/dL (ref 32.0–36.0)
MCV: 87.6 fL (ref 80.0–100.0)
MPV: 11.2 fL (ref 7.5–12.5)
Monocytes Relative: 12.7 %
Neutro Abs: 4637 cells/uL (ref 1500–7800)
Neutrophils Relative %: 73.6 %
Platelets: 202 10*3/uL (ref 140–400)
RBC: 4.76 10*6/uL (ref 3.80–5.10)
RDW: 12.6 % (ref 11.0–15.0)
Total Lymphocyte: 12.9 %
WBC: 6.3 10*3/uL (ref 3.8–10.8)

## 2023-02-04 LAB — COMPLETE METABOLIC PANEL WITH GFR
AG Ratio: 1.6 (calc) (ref 1.0–2.5)
ALT: 12 U/L (ref 6–29)
AST: 17 U/L (ref 10–35)
Albumin: 4.3 g/dL (ref 3.6–5.1)
Alkaline phosphatase (APISO): 64 U/L (ref 37–153)
BUN: 15 mg/dL (ref 7–25)
CO2: 29 mmol/L (ref 20–32)
Calcium: 9.4 mg/dL (ref 8.6–10.4)
Chloride: 104 mmol/L (ref 98–110)
Creat: 0.94 mg/dL (ref 0.60–1.00)
Globulin: 2.7 g/dL (calc) (ref 1.9–3.7)
Glucose, Bld: 92 mg/dL (ref 65–99)
Potassium: 4.2 mmol/L (ref 3.5–5.3)
Sodium: 140 mmol/L (ref 135–146)
Total Bilirubin: 0.5 mg/dL (ref 0.2–1.2)
Total Protein: 7 g/dL (ref 6.1–8.1)
eGFR: 65 mL/min/{1.73_m2} (ref >=60)

## 2023-02-04 LAB — TSH: TSH: 1.52 mIU/L (ref 0.40–4.50)

## 2023-02-05 ENCOUNTER — Encounter: Payer: Self-pay | Admitting: Internal Medicine

## 2023-02-05 ENCOUNTER — Ambulatory Visit (INDEPENDENT_AMBULATORY_CARE_PROVIDER_SITE_OTHER): Payer: PPO | Admitting: Internal Medicine

## 2023-02-05 ENCOUNTER — Other Ambulatory Visit (HOSPITAL_COMMUNITY): Payer: Self-pay

## 2023-02-05 VITALS — BP 126/66 | HR 88 | Temp 99.7°F | Ht 67.0 in | Wt 117.8 lb

## 2023-02-05 DIAGNOSIS — J22 Unspecified acute lower respiratory infection: Secondary | ICD-10-CM | POA: Diagnosis not present

## 2023-02-05 DIAGNOSIS — Z8739 Personal history of other diseases of the musculoskeletal system and connective tissue: Secondary | ICD-10-CM | POA: Diagnosis not present

## 2023-02-05 DIAGNOSIS — R82998 Other abnormal findings in urine: Secondary | ICD-10-CM

## 2023-02-05 DIAGNOSIS — M8580 Other specified disorders of bone density and structure, unspecified site: Secondary | ICD-10-CM | POA: Diagnosis not present

## 2023-02-05 DIAGNOSIS — E7800 Pure hypercholesterolemia, unspecified: Secondary | ICD-10-CM

## 2023-02-05 DIAGNOSIS — D242 Benign neoplasm of left breast: Secondary | ICD-10-CM | POA: Diagnosis not present

## 2023-02-05 DIAGNOSIS — N951 Menopausal and female climacteric states: Secondary | ICD-10-CM | POA: Diagnosis not present

## 2023-02-05 DIAGNOSIS — Z0000 Encounter for general adult medical examination without abnormal findings: Secondary | ICD-10-CM

## 2023-02-05 LAB — POCT URINALYSIS DIPSTICK
Bilirubin, UA: NEGATIVE
Glucose, UA: NEGATIVE
Ketones, UA: NEGATIVE
Nitrite, UA: POSITIVE
Protein, UA: NEGATIVE
Spec Grav, UA: <=1.005 (ref 1.010–1.025)
Urobilinogen, UA: 0.2 E.U./dL
pH, UA: 5 (ref 5.0–8.0)

## 2023-02-05 MED ORDER — AZITHROMYCIN 250 MG PO TABS
ORAL_TABLET | ORAL | 0 refills | Status: AC
  Filled 2023-02-05: qty 6, 5d supply, fill #0

## 2023-02-05 NOTE — Patient Instructions (Signed)
It was a pleasure to see you today.  We have prescribed Zithromax Z-PAK 2 tabs day 1 followed by 1 tab days 2 through 5 for respiratory infection symptoms.  Cholesterol is elevated.  Statin medication discussed but patient is not interested in taking statins at this time.  Bone density study ordered.  Last study done in 2015 and she has history of osteopenia.  Remote history of rheumatoid arthritis is not currently on treatment and not very symptomatic.  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  She saw Dr. Donne Hazel last year and they have agreed on close observation with mammography.  Discussed up-to-date on tetanus immunization through pharmacy.  Return in 1 year or as needed.

## 2023-02-07 LAB — URINALYSIS, MICROSCOPIC ONLY
Hyaline Cast: NONE SEEN /LPF
RBC / HPF: NONE SEEN /HPF (ref 0–2)
Squamous Epithelial / HPF: NONE SEEN /HPF (ref <=5)

## 2023-02-07 LAB — URINE CULTURE
MICRO NUMBER:: 14681122
SPECIMEN QUALITY:: ADEQUATE

## 2023-02-09 ENCOUNTER — Encounter: Payer: Self-pay | Admitting: Internal Medicine

## 2023-02-11 ENCOUNTER — Other Ambulatory Visit: Payer: Self-pay

## 2023-02-11 ENCOUNTER — Other Ambulatory Visit (HOSPITAL_BASED_OUTPATIENT_CLINIC_OR_DEPARTMENT_OTHER): Payer: Self-pay

## 2023-02-11 MED ORDER — CIPROFLOXACIN HCL 250 MG PO TABS
250.0000 mg | ORAL_TABLET | Freq: Two times a day (BID) | ORAL | 0 refills | Status: AC
  Filled 2023-02-11: qty 14, 7d supply, fill #0

## 2023-02-11 NOTE — Telephone Encounter (Signed)
Patient informed of results,  Rx sent to the pharmacy and nurse visit scheduled

## 2023-02-18 ENCOUNTER — Ambulatory Visit (INDEPENDENT_AMBULATORY_CARE_PROVIDER_SITE_OTHER): Payer: PPO

## 2023-02-18 VITALS — BP 110/76 | HR 64 | Temp 98.5°F | Ht 67.0 in | Wt 117.0 lb

## 2023-02-18 DIAGNOSIS — Z0189 Encounter for other specified special examinations: Secondary | ICD-10-CM | POA: Diagnosis not present

## 2023-02-18 LAB — POCT URINALYSIS DIPSTICK
Bilirubin, UA: NEGATIVE
Blood, UA: NEGATIVE
Glucose, UA: NEGATIVE
Ketones, UA: NEGATIVE
Leukocytes, UA: NEGATIVE
Nitrite, UA: NEGATIVE
Protein, UA: NEGATIVE
Spec Grav, UA: 1.01 (ref 1.010–1.025)
Urobilinogen, UA: 0.2 E.U./dL
pH, UA: 5 (ref 5.0–8.0)

## 2023-02-18 NOTE — Progress Notes (Signed)
Patient was seen in office for a nurse visit follow up on UTI,  urine dipstick was performed with negative results.  Vital signs are within normal limits.

## 2023-08-09 DIAGNOSIS — L820 Inflamed seborrheic keratosis: Secondary | ICD-10-CM | POA: Diagnosis not present

## 2023-11-01 ENCOUNTER — Encounter: Payer: Self-pay | Admitting: Internal Medicine

## 2023-11-01 ENCOUNTER — Other Ambulatory Visit (HOSPITAL_BASED_OUTPATIENT_CLINIC_OR_DEPARTMENT_OTHER): Payer: Self-pay

## 2023-11-01 ENCOUNTER — Ambulatory Visit (INDEPENDENT_AMBULATORY_CARE_PROVIDER_SITE_OTHER): Payer: PPO | Admitting: Internal Medicine

## 2023-11-01 VITALS — BP 120/70 | HR 74 | Temp 98.2°F | Ht 67.0 in | Wt 117.0 lb

## 2023-11-01 DIAGNOSIS — J069 Acute upper respiratory infection, unspecified: Secondary | ICD-10-CM | POA: Diagnosis not present

## 2023-11-01 MED ORDER — AZITHROMYCIN 250 MG PO TABS
ORAL_TABLET | ORAL | 0 refills | Status: AC
  Filled 2023-11-01: qty 6, 5d supply, fill #0

## 2023-11-01 NOTE — Progress Notes (Signed)
Patient Care Team: Margaree Mackintosh, MD as PCP - General (Internal Medicine)  Visit Date: 11/01/23  Subjective:    Patient ID: Regina Merritt , Female   DOB: 1951/11/24, 72 y.o.    MRN: [id number]   72 y.o. Female presents today for cough with sputum, congestion, sore throat. Symptoms started with tickle in her throat on 10/24/23. Using zinc lozenges for throat discomfort. Denies chills. Had a fever that is resolved now. Her husband has been sick recently.  No past medical history on file.   Family History  Problem Relation Age of Onset   Heart failure Father        Deceased   Breast cancer Neg Hx     Social History   Social History Narrative       Social history: Been married twice.  2 children.  Non-smoker.  No alcohol consumption.  Has retired as a Technical brewer at [employer].       Family history: 1 brother.  Father with history of hypertension.          Review of Systems  Constitutional:  Negative for fever and malaise/fatigue.  HENT:  Positive for congestion and sore throat.   Eyes:  Negative for blurred vision.  Respiratory:  Positive for cough and sputum production. Negative for shortness of breath.   Cardiovascular:  Negative for chest pain, palpitations and leg swelling.  Gastrointestinal:  Negative for vomiting.  Musculoskeletal:  Negative for back pain.  Skin:  Negative for rash.  Neurological:  Negative for loss of consciousness and headaches.        Objective:   Vitals: BP 120/70   Pulse 74   Temp 98.2 F (36.8 C)   Ht 5\' 7"  (1.702 m)   Wt 117 lb (53.1 kg)   SpO2 97%   BMI 18.32 kg/m    Physical Exam Vitals and nursing note reviewed.  Constitutional:      General: She is not in acute distress.    Appearance: Normal appearance. She is not toxic-appearing.  HENT:     Head: Normocephalic and atraumatic.     Right Ear: Hearing, tympanic membrane, ear canal and external ear normal.     Left Ear: Hearing, tympanic membrane, ear canal and  external ear normal.     Mouth/Throat:     Comments: Pharynx injected without exudate. Pulmonary:     Effort: Pulmonary effort is normal. No respiratory distress.     Breath sounds: Normal breath sounds. No wheezing or rales.  Lymphadenopathy:     Cervical: No cervical adenopathy.  Skin:    General: Skin is warm and dry.  Neurological:     Mental Status: She is alert and oriented to person, place, and time. Mental status is at baseline.  Psychiatric:        Mood and Affect: Mood normal.        Behavior: Behavior normal.        Thought Content: Thought content normal.        Judgment: Judgment normal.       Results:   Studies obtained and personally reviewed by me:   Labs:       Component Value Date/Time   NA 140 02/04/2023 0926   K 4.2 02/04/2023 0926   CL 104 02/04/2023 0926   CO2 29 02/04/2023 0926   GLUCOSE 92 02/04/2023 0926   BUN 15 02/04/2023 0926   CREATININE 0.94 02/04/2023 0926   CALCIUM 9.4 02/04/2023 0926  PROT 7.0 02/04/2023 0926   ALBUMIN 4.1 09/13/2014 1011   AST 17 02/04/2023 0926   ALT 12 02/04/2023 0926   ALKPHOS 50 09/13/2014 1011   BILITOT 0.5 02/04/2023 0926   GFRNONAA 55 (L) 10/26/2020 1115   GFRAA 64 10/26/2020 1115     Lab Results  Component Value Date   WBC 6.3 02/04/2023   HGB 13.8 02/04/2023   HCT 41.7 02/04/2023   MCV 87.6 02/04/2023   PLT 202 02/04/2023    Lab Results  Component Value Date   CHOL 220 (H) 02/04/2023   HDL 64 02/04/2023   LDLCALC 138 (H) 02/04/2023   TRIG 82 02/04/2023   CHOLHDL 3.4 02/04/2023    No results found for: "HGBA1C"   Lab Results  Component Value Date   TSH 1.52 02/04/2023      Assessment & Plan:   Acute upper respiratory infection: prescribed Z-Pak two tabs day 1 followed by one tab days 2-5. Get plenty of rest and stay well-hydrated. Contact us if symptoms worsen or fail to improve.    I,Alexander Ruley,acting as a Neurosurgeon for Margaree Mackintosh, MD.,have documented all relevant  documentation on the behalf of Margaree Mackintosh, MD,as directed by  Margaree Mackintosh, MD while in the presence of Margaree Mackintosh, MD.   I, Margaree Mackintosh, MD, have reviewed all documentation for this visit. The documentation on 11/01/23 for the exam, diagnosis, procedures, and orders are all accurate and complete.

## 2023-11-01 NOTE — Patient Instructions (Signed)
Take Zithromax Z-PAK 2 tabs day 1 followed by 1 tab days 2 through 5.  Rest and stay well-hydrated.  Call us if symptoms worsen or fail to improve within a few days.  It was a pleasure to see you today.

## 2023-11-22 DIAGNOSIS — H25813 Combined forms of age-related cataract, bilateral: Secondary | ICD-10-CM | POA: Diagnosis not present

## 2023-11-28 DIAGNOSIS — L820 Inflamed seborrheic keratosis: Secondary | ICD-10-CM | POA: Diagnosis not present

## 2023-11-28 DIAGNOSIS — D1801 Hemangioma of skin and subcutaneous tissue: Secondary | ICD-10-CM | POA: Diagnosis not present

## 2023-11-28 DIAGNOSIS — D225 Melanocytic nevi of trunk: Secondary | ICD-10-CM | POA: Diagnosis not present

## 2023-11-28 DIAGNOSIS — D485 Neoplasm of uncertain behavior of skin: Secondary | ICD-10-CM | POA: Diagnosis not present

## 2023-12-16 DIAGNOSIS — H35372 Puckering of macula, left eye: Secondary | ICD-10-CM | POA: Diagnosis not present

## 2023-12-16 DIAGNOSIS — H25031 Anterior subcapsular polar age-related cataract, right eye: Secondary | ICD-10-CM | POA: Diagnosis not present

## 2023-12-30 ENCOUNTER — Other Ambulatory Visit (HOSPITAL_BASED_OUTPATIENT_CLINIC_OR_DEPARTMENT_OTHER): Payer: Self-pay

## 2023-12-30 MED ORDER — OFLOXACIN 0.3 % OP SOLN
1.0000 [drp] | Freq: Four times a day (QID) | OPHTHALMIC | 0 refills | Status: AC
  Filled 2023-12-30: qty 5, 25d supply, fill #0

## 2023-12-30 MED ORDER — PREDNISOLONE ACETATE 1 % OP SUSP
OPHTHALMIC | 0 refills | Status: AC
  Filled 2023-12-30: qty 5, 28d supply, fill #0

## 2024-01-01 IMAGING — MG MM BREAST LOCALIZATION CLIP
4 series · 4 of 12 positions shown · non-contrast
Comparison: Previous exam(s).

CLINICAL DATA: Status post ultrasound-guided core biopsy of LEFT
breast mass.

EXAM:
3D DIAGNOSTIC LEFT MAMMOGRAM POST ULTRASOUND BIOPSY

[L CC synth-2D]
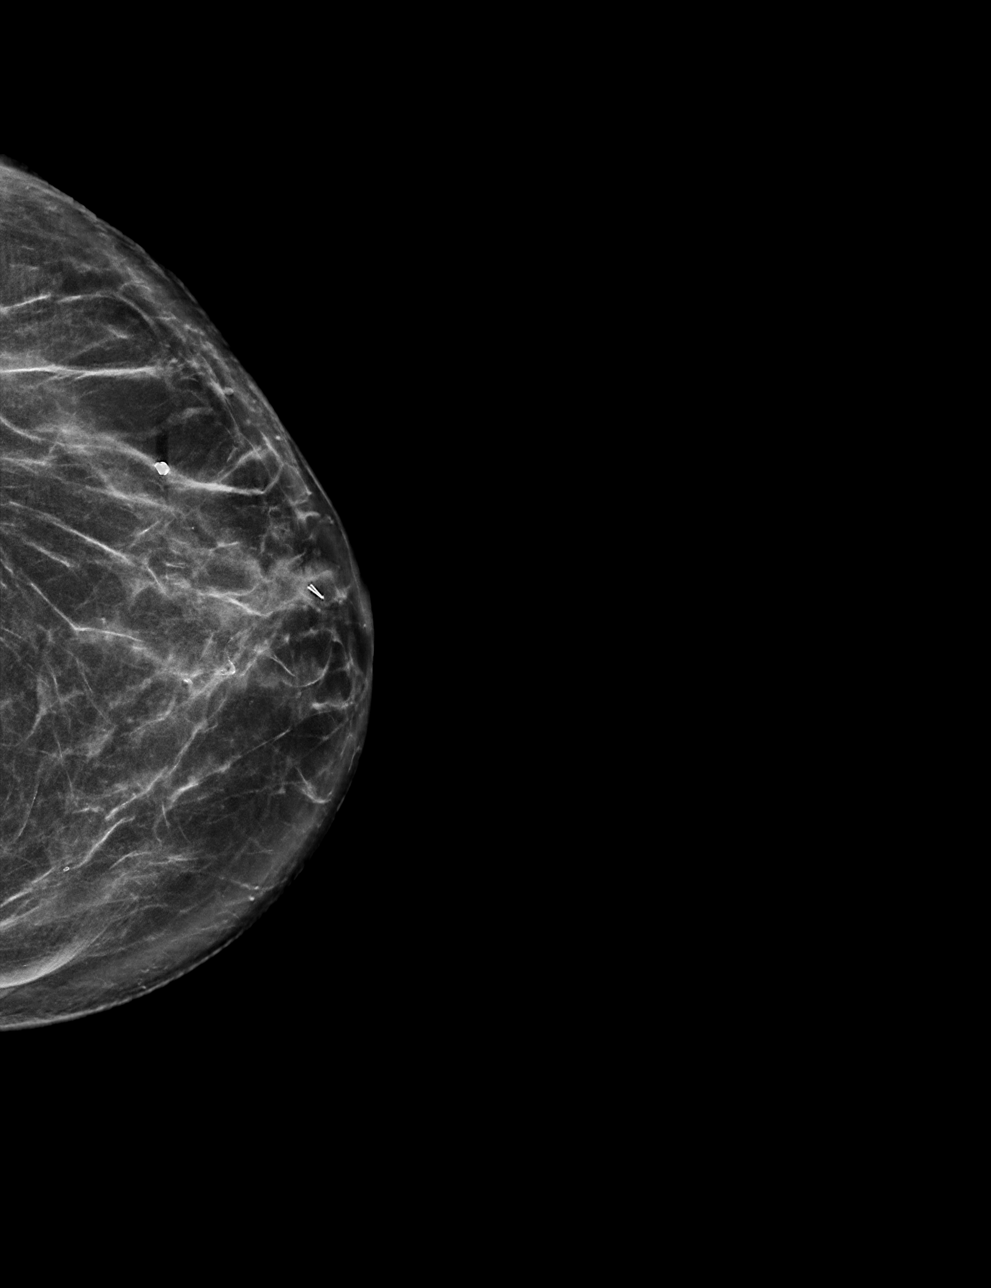

[L ML synth-2D]
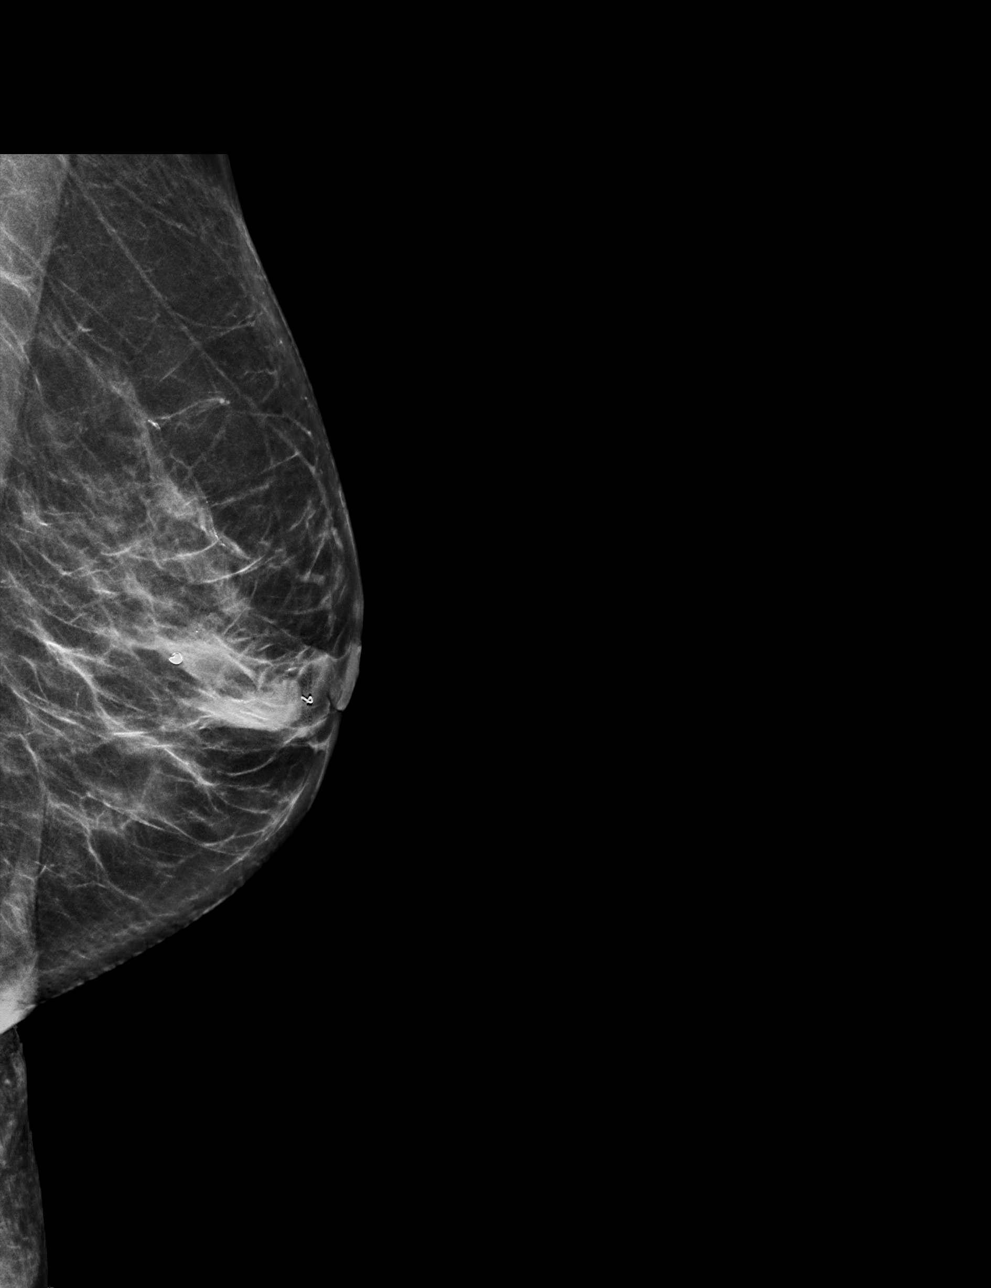

[L CC tomo · tomo slice 35/68.0]
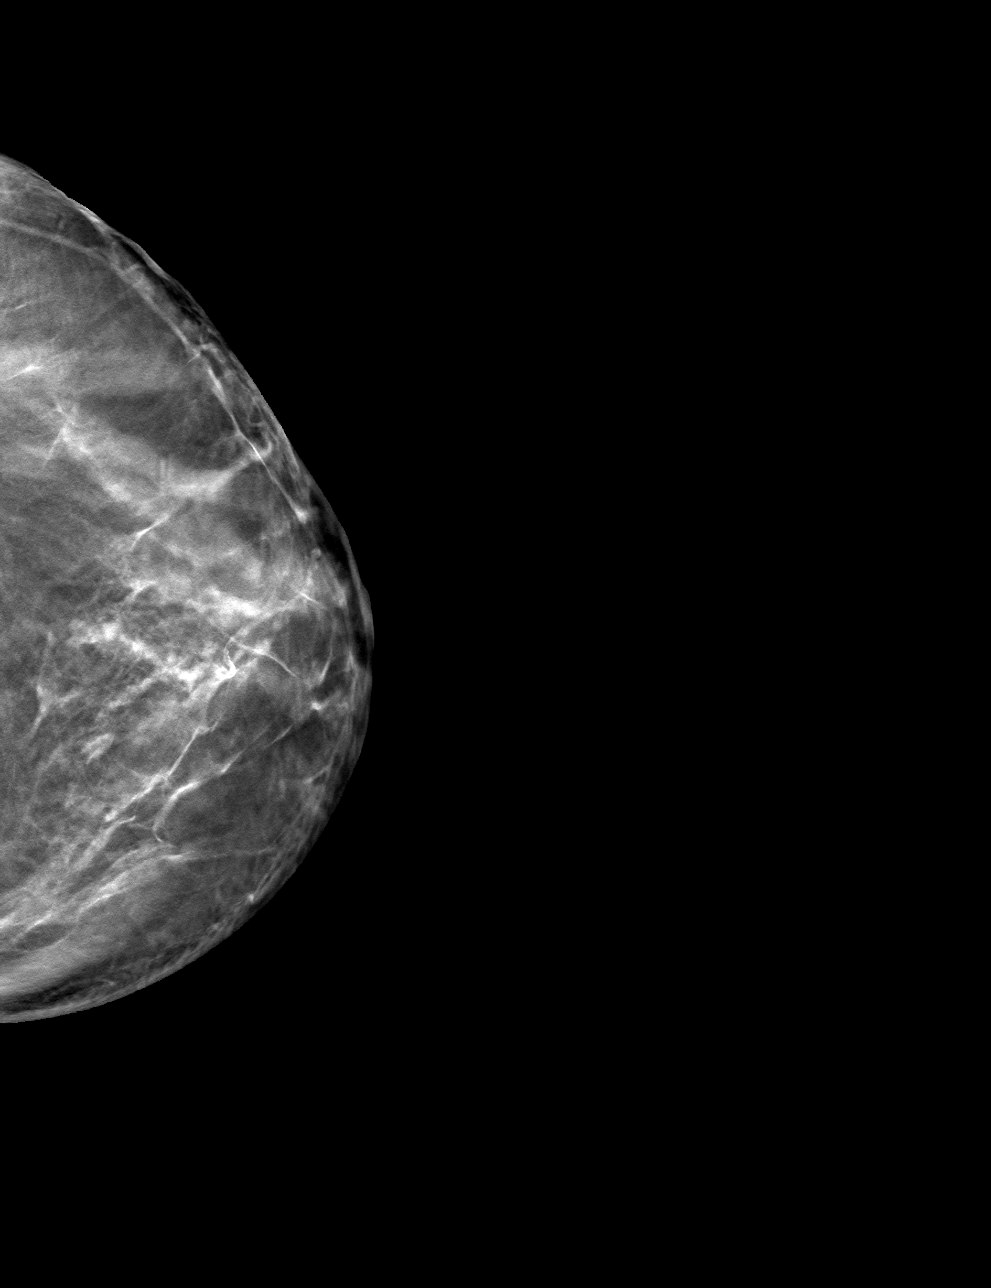

[L ML tomo · tomo slice 33/64.0]
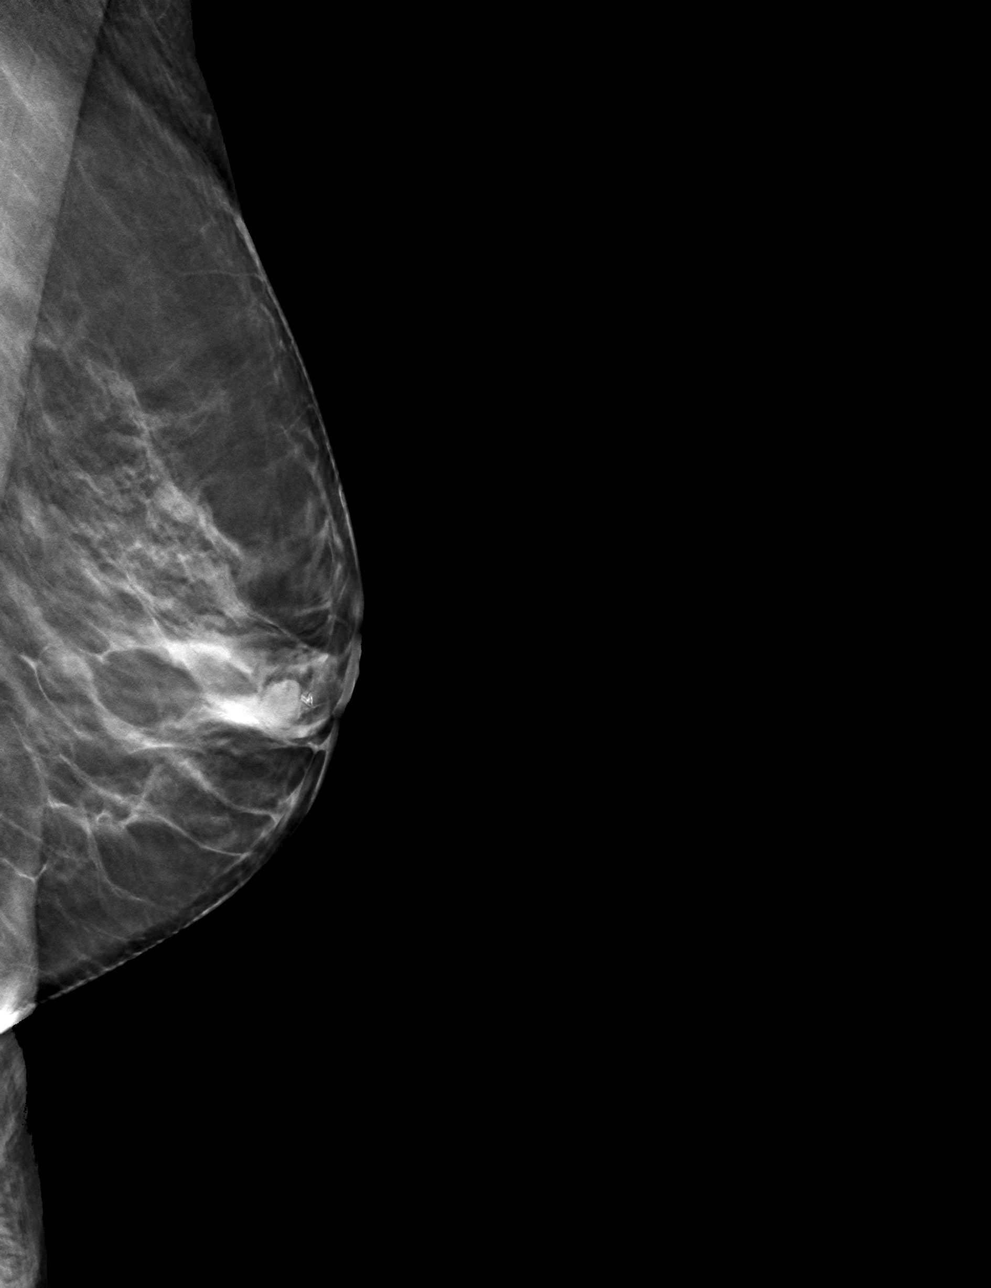

[4 of 12 positions shown; findings below may reference images not displayed]

FINDINGS: 3D Mammographic images were obtained following ultrasound guided
biopsy of mass in the retroareolar region of the LEFT breast and
placement a ribbon shaped. The biopsy marking clip is in expected
position at the site of biopsy.
IMPRESSION: Appropriate positioning of the ribbon shaped biopsy marking clip at
the site of biopsy in the retroareolar LEFT breast.

Final Assessment: Post Procedure Mammograms for Marker Placement

## 2024-01-01 IMAGING — US US BREAST BX W LOC DEV 1ST LESION IMG BX SPEC US GUIDE*L*
1 series · 12 of 12 positions shown · non-contrast
Comparison: Previous exam(s).
COMPARISON: Previous exam(s).

Addendum:
CLINICAL DATA: Patient presents for ultrasound-guided biopsy of
retroareolar mass in the LEFT breast.

EXAM:
ULTRASOUND GUIDED LEFT BREAST CORE NEEDLE BIOPSY

[Series 1: us breast bx w loc dev 1st lesion img bx spec us g · 0.06mm/px · 12 of 12 slices shown]
[im 1/12]
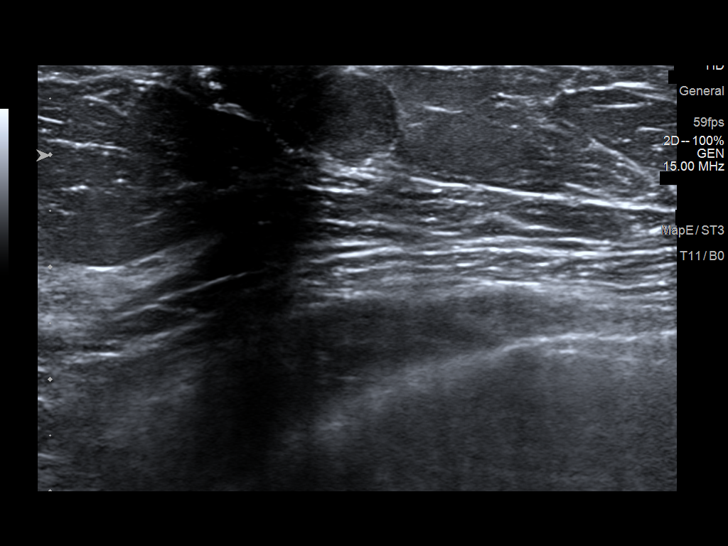
[im 2/12]
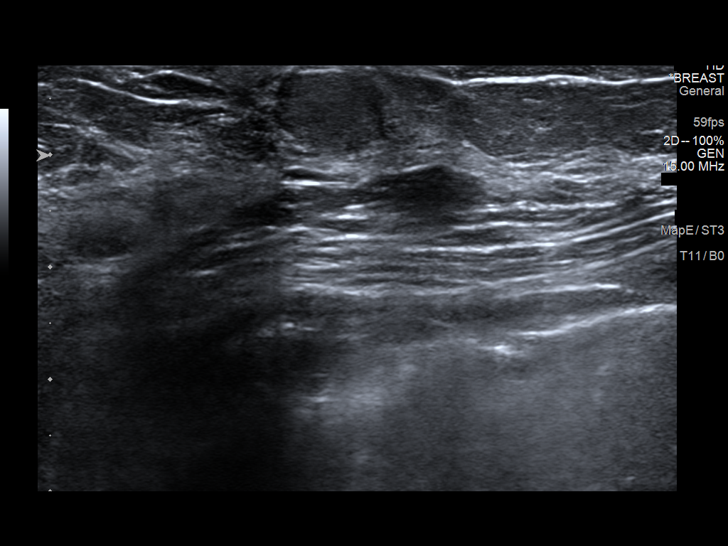
[im 3/12]
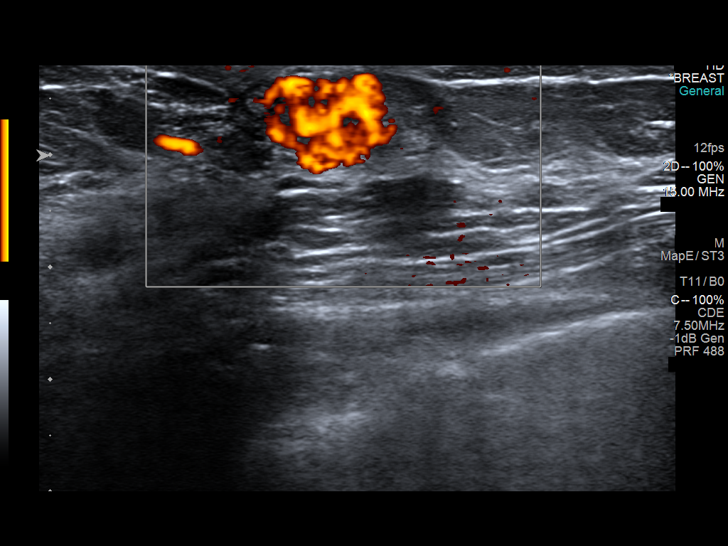
[im 4/12]
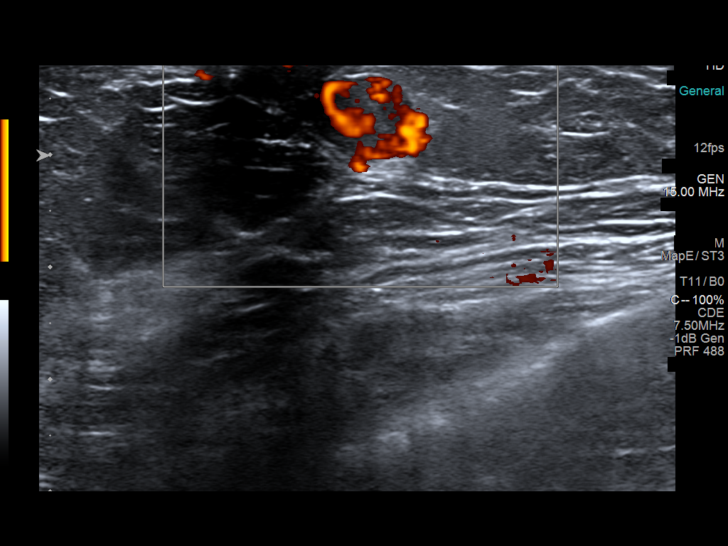
[im 5/12]
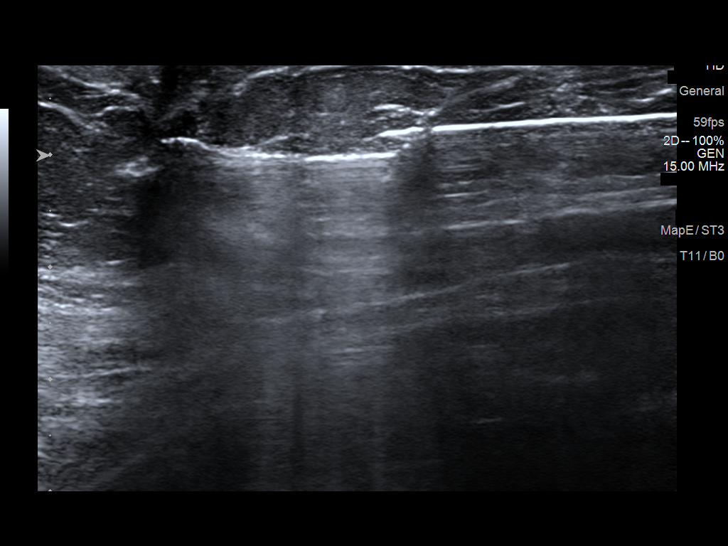
[im 6/12]
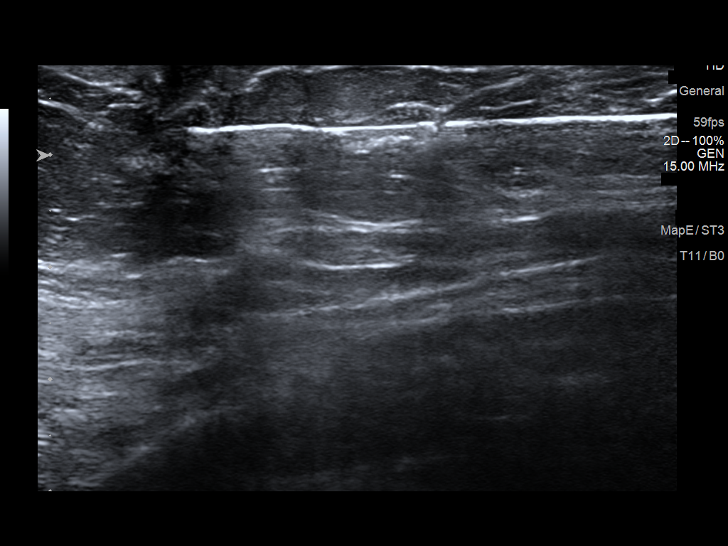
[im 7/12]
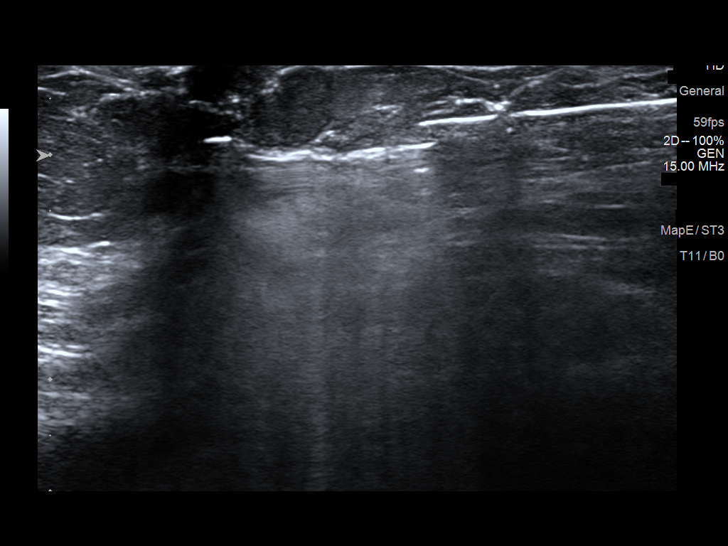
[im 8/12]
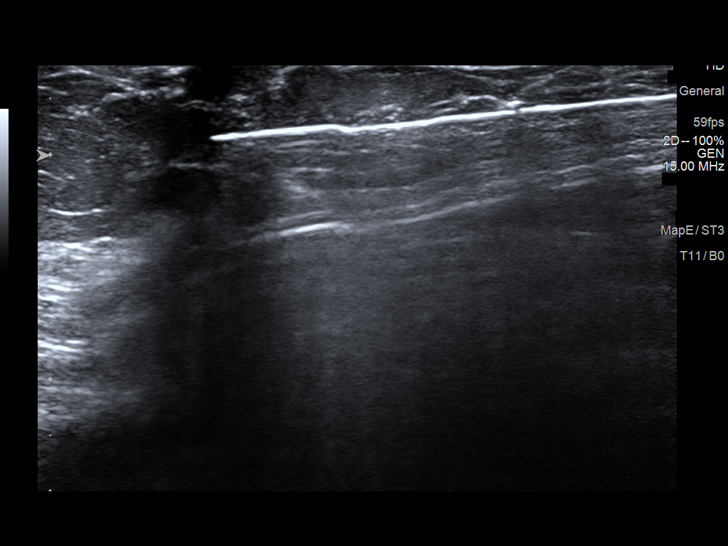
[im 9/12]
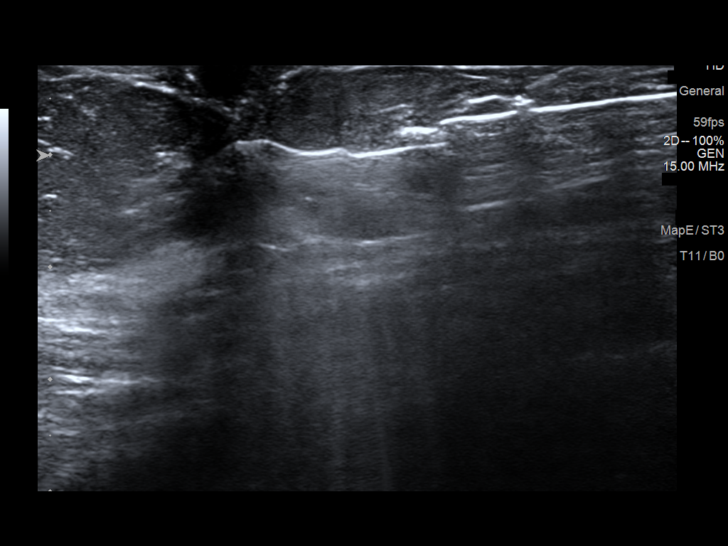
[im 10/12]
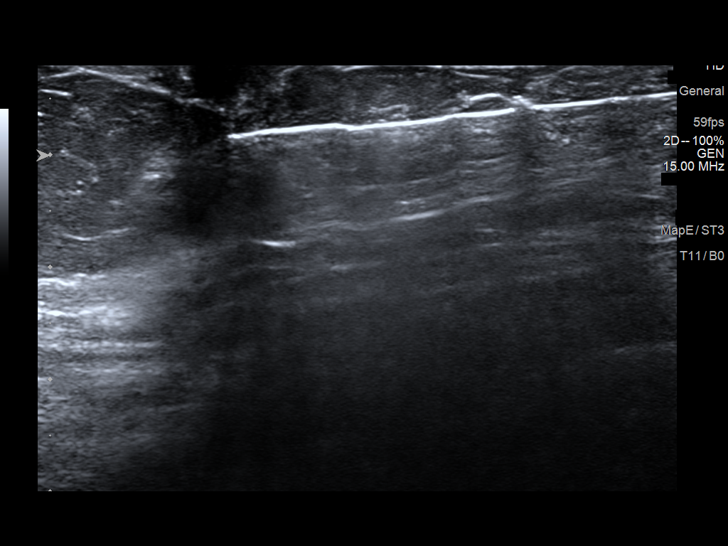
[im 11/12]
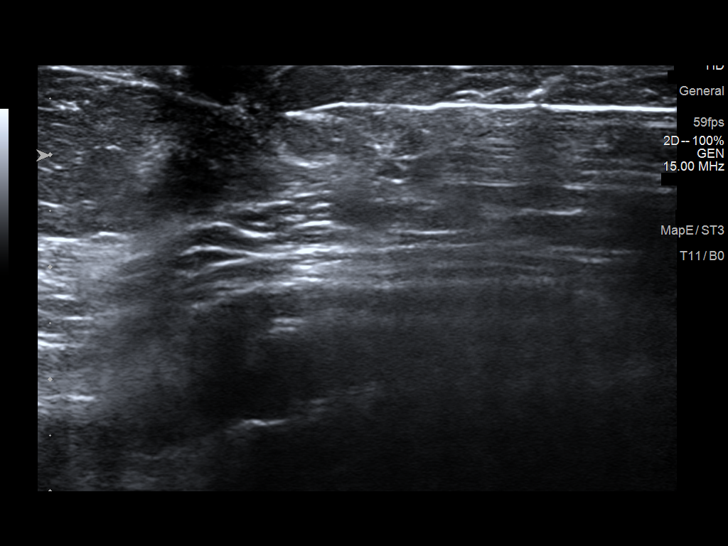
[im 12/12]
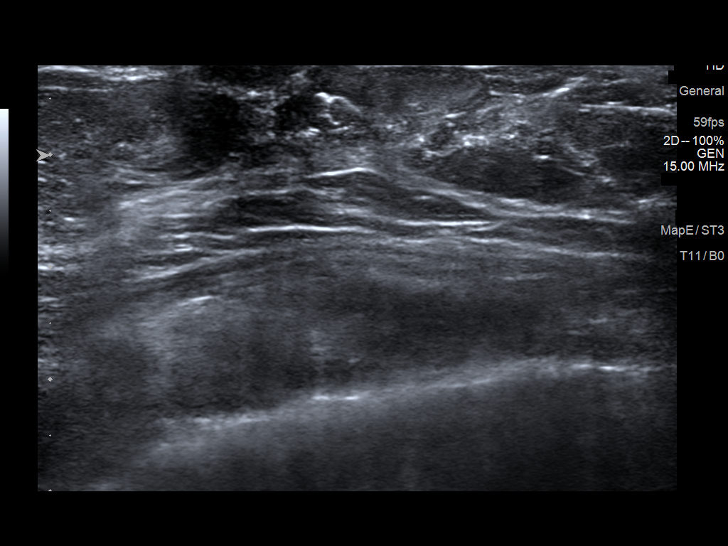

[12 of 12 positions shown; findings below may reference images not displayed]



Lesion quadrant: 6 o'clock retroareolar LEFT breast

Using sterile technique and 1% Lidocaine and 1% lidocaine with
epinephrine as local anesthetic, under direct ultrasound
visualization, a 12 gauge Yama device was used to perform
biopsy of mass in the retroareolar LEFT breast, 6 o'clock location
using a LATERAL approach. At the conclusion of the procedure ribbon
tissue marker clip was deployed into the biopsy cavity. Follow up 2
view mammogram was performed and dictated separately.
IMPRESSION: Ultrasound guided biopsy of LEFT breast mass. No apparent
complications.

ADDENDUM:
Pathology revealed INTRADUCTAL PAPILLOMA WITH EXTENSIVE APOCRINE
METAPLASIA- NO ATYPIA OR MALIGNANCY IDENTIFIED of the LEFT breast, 6
o'clock, retroareolar (ribbon clip). This was found to be concordant
by Dr. Rakelita Rogel, with surgical consultation for excision
recommended, per [HOSPITAL] Breast Working Group protocol.

The patient reported doing well after the biopsy with tenderness at
the site. Post biopsy instructions and care were reviewed, and
questions were answered. The patient was encouraged to call The
direct phone number was provided.

Per patient request, surgical consultation has been arranged with
Dr. Klpigbb Moolman at [REDACTED] on March 20, 2022.

Pathology results reported by Fachrizal Ica RN on 02/08/2022.

Pathology results were discussed with the patient by telephone. The
patient reported doing well after the biopsy with tenderness at the
site. Post biopsy instructions and care were reviewed and questions
were answered. The patient was encouraged to call The [REDACTED]



Lesion quadrant: 6 o'clock retroareolar LEFT breast

Using sterile technique and 1% Lidocaine and 1% lidocaine with
epinephrine as local anesthetic, under direct ultrasound
visualization, a 12 gauge Yama device was used to perform
biopsy of mass in the retroareolar LEFT breast, 6 o'clock location
using a LATERAL approach. At the conclusion of the procedure ribbon
tissue marker clip was deployed into the biopsy cavity. Follow up 2
view mammogram was performed and dictated separately.
IMPRESSION: Ultrasound guided biopsy of LEFT breast mass. No apparent
complications.

## 2024-01-03 ENCOUNTER — Other Ambulatory Visit (HOSPITAL_BASED_OUTPATIENT_CLINIC_OR_DEPARTMENT_OTHER): Payer: Self-pay

## 2024-01-03 DIAGNOSIS — H25812 Combined forms of age-related cataract, left eye: Secondary | ICD-10-CM | POA: Diagnosis not present

## 2024-01-29 ENCOUNTER — Other Ambulatory Visit (HOSPITAL_BASED_OUTPATIENT_CLINIC_OR_DEPARTMENT_OTHER): Payer: Self-pay

## 2024-01-30 ENCOUNTER — Other Ambulatory Visit (HOSPITAL_BASED_OUTPATIENT_CLINIC_OR_DEPARTMENT_OTHER): Payer: Self-pay

## 2024-01-30 MED ORDER — OFLOXACIN 0.3 % OP SOLN
1.0000 [drp] | Freq: Four times a day (QID) | OPHTHALMIC | 0 refills | Status: AC
  Filled 2024-01-30: qty 5, 7d supply, fill #0

## 2024-01-30 MED ORDER — PREDNISOLONE ACETATE 1 % OP SUSP
OPHTHALMIC | 0 refills | Status: AC
  Filled 2024-01-30: qty 5, 28d supply, fill #0

## 2024-01-31 DIAGNOSIS — H25811 Combined forms of age-related cataract, right eye: Secondary | ICD-10-CM | POA: Diagnosis not present

## 2024-01-31 DIAGNOSIS — F17219 Nicotine dependence, cigarettes, with unspecified nicotine-induced disorders: Secondary | ICD-10-CM | POA: Diagnosis not present

## 2024-02-03 ENCOUNTER — Encounter: Payer: Self-pay | Admitting: Internal Medicine

## 2024-02-03 NOTE — Progress Notes (Signed)
 Annual Wellness Visit   Patient Care Team: Margaree Mackintosh, MD as PCP - General (Internal Medicine)  Visit Date: 02/06/24   Chief Complaint  Patient presents with   Medicare Annual Wellness   Subjective:  Patient: Regina Merritt, Female DOB: [birth year], 73 y.o. MRN: [id number]  Regina Merritt is a 73 y.o. Female who presents today for her Medicare Annual Wellness Visit. She is also here for health maintenance exam and evaluation of medical issues. She has a history of Osteopenia, Rheumatoid Arthritis, Migraine Headaches, Onychomycosis.   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 Currently is not on Methotrexate, which has been prescribed to her in the past for management.  Discussed Lipid Panel, compared to 02/04/2023: Cholesterol 224, elevated from 220; LDL 135, slightly decreased from 138. She says that she would not like to be on any anti-lipid medications. Discussed CT Coronary Calcium Scoring.   S/p Left/Right Cataract Surgery 2025. She says that she is seeing much clearer now.  Labs 02/04/2024 CBC: WNL CMP: WNL TSH: 2.28  PAP Smear 09/13/2014 normal; Pelvic exam deferred at pt request.   Mammogram 12/2021 found a possible mass in the left breast; 01/29/22 [place] of left breast showed suspicious mass at 6 o'clock position. US biopsy on 02/06/22 showed INTRADUCTAL PAPILLOMA w/ EXTENSIVE APOCRINE METAPLASIA - NO ATYPIA OR MALIGNANCY IDENTIFIED of the LEFT breast, 6 o'clock, retroareolar (ribbon clip). Repeat study ordered by Dr. Dwain Sarna as well as mammogram overdue.   Negative Cologuard in December 2023 with recommended repeat 2026.   History of Osteopenia; Bone Density 09/02/14 T-score Right Femur Neck -2.2 & -1.4 at AP Lumbar Spine.    Vaccine Counseling: Due for Flu, Covid-19, Shingles 1/2, and Tdap - says that she has had Shingles, likely  through [facility]; will obtain records; UTD on PNA.  History reviewed. No pertinent past medical history.  Medical/Surgical History Narrative:  Allergic to Penicillin - hives. Intolerant of Keflex.    2011 - Saw Dr. Arbie Cookey Varicosities, Right Leg and was treated with compression stockings; was felt to be a good candidate for Laser Ablation & Stab Phlebectomy.   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    C. 2005 - became menopausal; remote hx of Migraine Headaches that resolved after Menopause.    1999 - Onychomycosis treated with Sporanox 1999.  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   1989 - Bilateral Tubal Ligation.  Family History  Problem Relation Age of Onset   Heart failure Father        Deceased   Breast cancer Neg Hx     Social History   Social History Narrative   Married twice. 2 children. Non-smoker.  No alcohol consumption. Retired - previously worked as a Technical brewer at [employer].   Fhx:    Father w/ hx of Hypertension.   1 Brother   Review of Systems  Constitutional:  Negative for chills, fever, malaise/fatigue and weight loss.  HENT:  Negative for hearing loss, sinus pain and sore throat.   Respiratory:  Negative for cough, hemoptysis and shortness of breath.   Cardiovascular:  Negative for chest pain, palpitations, leg swelling and PND.  Gastrointestinal:  Negative for abdominal pain, constipation, diarrhea, heartburn, nausea and vomiting.  Genitourinary:  Negative for dysuria, frequency and urgency.  Musculoskeletal:  Negative for back pain,  myalgias and neck pain.  Skin:  Negative for itching and rash.  Neurological:  Negative for dizziness, tingling, seizures and headaches.  Endo/Heme/Allergies:  Negative for polydipsia.  Psychiatric/Behavioral:  Negative for depression. The patient is not nervous/anxious.     Objective:  Vitals: BP 122/72   Pulse 74   Temp 98.3 F (36.8 C)   Wt 120 lb (54.4 kg)   SpO2 94%    BMI 18.79 kg/m  Physical Exam Vitals and nursing note reviewed.  Constitutional:      General: She is not in acute distress.    Appearance: Normal appearance. She is not ill-appearing or toxic-appearing.  HENT:     Head: Normocephalic and atraumatic.     Right Ear: Hearing, tympanic membrane, ear canal and external ear normal.     Left Ear: Hearing, tympanic membrane, ear canal and external ear normal.     Mouth/Throat:     Pharynx: Oropharynx is clear.  Eyes:     Extraocular Movements: Extraocular movements intact.     Pupils: Pupils are equal, round, and reactive to light.  Neck:     Thyroid: No thyroid mass, thyromegaly or thyroid tenderness.     Vascular: No carotid bruit.  Cardiovascular:     Rate and Rhythm: Normal rate and regular rhythm. No extrasystoles are present.    Pulses:          Dorsalis pedis pulses are 2+ on the right side and 2+ on the left side.     Heart sounds: Normal heart sounds. No murmur heard.    No friction rub. No gallop.  Pulmonary:     Effort: Pulmonary effort is normal.     Breath sounds: Normal breath sounds. No decreased breath sounds, wheezing, rhonchi or rales.  Chest:     Chest wall: No mass.  Abdominal:     Palpations: Abdomen is soft. There is no hepatomegaly, splenomegaly or mass.     Tenderness: There is no abdominal tenderness.     Hernia: No hernia is present.  Musculoskeletal:     Cervical back: Normal range of motion.     Right lower leg: No edema.     Left lower leg: No edema.  Lymphadenopathy:     Cervical: No cervical adenopathy.     Upper Body:     Right upper body: No supraclavicular adenopathy.     Left upper body: No supraclavicular adenopathy.  Skin:    General: Skin is warm and dry.  Neurological:     General: No focal deficit present.     Mental Status: She is alert and oriented to person, place, and time. Mental status is at baseline.     Sensory: Sensation is intact.     Motor: Motor function is intact. No  weakness.     Deep Tendon Reflexes: Reflexes are normal and symmetric.  Psychiatric:        Attention and Perception: Attention normal.        Mood and Affect: Mood normal.        Speech: Speech normal.        Behavior: Behavior normal.        Thought Content: Thought content normal.        Cognition and Memory: Cognition normal.        Judgment: Judgment normal.   Most Recent Functional Status Assessment:    02/06/2024    2:06 PM  In your present state of health, do you have any difficulty performing the  following activities:  Hearing? 0  Vision? 0  Difficulty concentrating or making decisions? 0  Walking or climbing stairs? 0  Dressing or bathing? 0  Doing errands, shopping? 0  Preparing Food and eating ? N  Using the Toilet? N  In the past six months, have you accidently leaked urine? N  Do you have problems with loss of bowel control? N  Managing your Medications? N  Managing your Finances? N  Housekeeping or managing your Housekeeping? N   Most Recent Fall Risk Assessment:    02/06/2024    2:07 PM  Fall Risk   Falls in the past year? 0  Follow up Falls evaluation completed   Most Recent Depression Screenings:    02/18/2023   11:19 AM 02/05/2023   10:05 AM  PHQ 2/9 Scores  PHQ - 2 Score 0 0   Most Recent Cognitive Screening:    02/05/2023   10:21 AM  6CIT Screen  What Year? 0 points  What time? 0 points  Count back from 20 0 points  Months in reverse 0 points  Repeat phrase 0 points   Results:  Studies Obtained And Personally Reviewed By Me:  History of Osteopenia; Bone Density 09/02/14 T-score Right Femur Neck -2.2 & -1.4 at AP Lumbar Spine.    PAP Smear 09/13/2014 normal; Pelvic exam deferred at pt request.   Mammogram 12/2021 found a possible mass in the left breast; 01/29/22 [place] of left breast showed suspicious mass at 6 o'clock position. US biopsy on 02/06/22 showed INTRADUCTAL PAPILLOMA WITH EXTENSIVE APOCRINE METAPLASIA- NO ATYPIA OR MALIGNANCY  IDENTIFIED of the LEFT breast, 6 o'clock, retroareolar (ribbon clip). Repeat study ordered by Dr. Dwain Sarna as well as mammogram overdue.   Negative Cologuard in December 2023 with recommended repeat 2026.  Labs:     Component Value Date/Time   NA 139 02/04/2024 0939   K 4.6 02/04/2024 0939   CL 104 02/04/2024 0939   CO2 30 02/04/2024 0939   GLUCOSE 86 02/04/2024 0939   BUN 19 02/04/2024 0939   CREATININE 0.96 02/04/2024 0939   CALCIUM 9.4 02/04/2024 0939   PROT 6.7 02/04/2024 0939   ALBUMIN 4.1 09/13/2014 1011   AST 15 02/04/2024 0939   ALT 11 02/04/2024 0939   ALKPHOS 50 09/13/2014 1011   BILITOT 0.5 02/04/2024 0939   GFRNONAA 55 (L) 10/26/2020 1115   GFRAA 64 10/26/2020 1115    Lab Results  Component Value Date   WBC 5.7 02/04/2024   HGB 13.3 02/04/2024   HCT 41.0 02/04/2024   MCV 89.1 02/04/2024   PLT 241 02/04/2024   Lab Results  Component Value Date   CHOL 224 (H) 02/04/2024   HDL 72 02/04/2024   LDLCALC 135 (H) 02/04/2024   TRIG 80 02/04/2024   CHOLHDL 3.1 02/04/2024   Lab Results  Component Value Date   TSH 2.28 02/04/2024    Assessment & Plan:   Orders Placed This Encounter  Procedures   POCT Urinalysis Dipstick (Automated)  Other Labs Reviewed today: CBC: WNL CMP: WNL TSH: 2.28  Seronegative Rheumatoid Arthritis - had positive ANA with speckled pattern and a negative CCP. Currently is not on Methotrexate, which has been prescribed to her in the past for management.  Discussed Elevated Cholesterol: Lipid Panel, compared to 02/04/2023: Cholesterol 224, elevated from 220; LDL 135, slightly decreased from 138. She says that she would not like to be on any anti-lipid medications. Discussed CT Coronary Calcium Scoring.   S/p Left & Right Cataract  Surgery 2025. She says that she is seeing much clearer now.  PAP Smear 09/13/2014 normal; Pelvic exam deferred at pt request.   Mammogram 12/2021 found a possible mass in the left breast; 01/29/22 [place] of left  breast showed suspicious mass at 6 o'clock position. 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 Repeat study ordered by Dr. Dwain Sarna as well as mammogram overdue.   Negative Cologuard in December 2023 with recommended repeat 2026.  Osteopenia; Bone Density 09/02/14 T-score Right Femur Neck -2.2 & -1.4 at AP Lumbar Spine.    Vaccine Counseling: Due for Flu, Covid-19, Shingles 1/2, and Tdap - says that she has had Shingles, likely through [facility]; will obtain records; UTD on PNA.    Annual wellness visit done today including the all of the following: Reviewed patient's Family Medical History Reviewed and updated list of patient's medical providers Assessment of cognitive impairment was done Assessed patient's functional ability Established a written schedule for health screening services Health Risk Assessent Completed and Reviewed  Discussed health benefits of physical activity, and encouraged her to engage in regular exercise appropriate for her age and condition.    I,Emily Lagle,acting as a Neurosurgeon for Margaree Mackintosh, MD.,have documented all relevant documentation on the behalf of Margaree Mackintosh, MD,as directed by  Margaree Mackintosh, MD while in the presence of Margaree Mackintosh, MD.   I, Margaree Mackintosh, MD, have reviewed all documentation for this visit. The documentation on 02/23/24 for the exam, diagnosis, procedures, and orders are all accurate and complete.

## 2024-02-04 ENCOUNTER — Ambulatory Visit: Payer: PPO | Admitting: Internal Medicine

## 2024-02-04 ENCOUNTER — Encounter: Payer: PPO | Admitting: Internal Medicine

## 2024-02-04 ENCOUNTER — Other Ambulatory Visit (HOSPITAL_BASED_OUTPATIENT_CLINIC_OR_DEPARTMENT_OTHER): Payer: Self-pay

## 2024-02-04 ENCOUNTER — Other Ambulatory Visit: Payer: PPO

## 2024-02-04 DIAGNOSIS — E7800 Pure hypercholesterolemia, unspecified: Secondary | ICD-10-CM | POA: Diagnosis not present

## 2024-02-04 DIAGNOSIS — Z1329 Encounter for screening for other suspected endocrine disorder: Secondary | ICD-10-CM | POA: Diagnosis not present

## 2024-02-04 DIAGNOSIS — M8580 Other specified disorders of bone density and structure, unspecified site: Secondary | ICD-10-CM

## 2024-02-04 DIAGNOSIS — Z0000 Encounter for general adult medical examination without abnormal findings: Secondary | ICD-10-CM | POA: Diagnosis not present

## 2024-02-05 LAB — COMPLETE METABOLIC PANEL WITH GFR
AG Ratio: 1.7 (calc) (ref 1.0–2.5)
ALT: 11 U/L (ref 6–29)
AST: 15 U/L (ref 10–35)
Albumin: 4.2 g/dL (ref 3.6–5.1)
Alkaline phosphatase (APISO): 57 U/L (ref 37–153)
BUN: 19 mg/dL (ref 7–25)
CO2: 30 mmol/L (ref 20–32)
Calcium: 9.4 mg/dL (ref 8.6–10.4)
Chloride: 104 mmol/L (ref 98–110)
Creat: 0.96 mg/dL (ref 0.60–1.00)
Globulin: 2.5 g/dL (ref 1.9–3.7)
Glucose, Bld: 86 mg/dL (ref 65–99)
Potassium: 4.6 mmol/L (ref 3.5–5.3)
Sodium: 139 mmol/L (ref 135–146)
Total Bilirubin: 0.5 mg/dL (ref 0.2–1.2)
Total Protein: 6.7 g/dL (ref 6.1–8.1)
eGFR: 63 mL/min/{1.73_m2} (ref >=60)

## 2024-02-05 LAB — CBC WITH DIFFERENTIAL/PLATELET
Absolute Lymphocytes: 1020 {cells}/uL (ref 850–3900)
Absolute Monocytes: 593 {cells}/uL (ref 200–950)
Basophils Absolute: 23 {cells}/uL (ref 0–200)
Basophils Relative: 0.4 %
Eosinophils Absolute: 63 {cells}/uL (ref 15–500)
Eosinophils Relative: 1.1 %
HCT: 41 % (ref 35.0–45.0)
Hemoglobin: 13.3 g/dL (ref 11.7–15.5)
MCH: 28.9 pg (ref 27.0–33.0)
MCHC: 32.4 g/dL (ref 32.0–36.0)
MCV: 89.1 fL (ref 80.0–100.0)
MPV: 10.9 fL (ref 7.5–12.5)
Monocytes Relative: 10.4 %
Neutro Abs: 4001 {cells}/uL (ref 1500–7800)
Neutrophils Relative %: 70.2 %
Platelets: 241 10*3/uL (ref 140–400)
RBC: 4.6 10*6/uL (ref 3.80–5.10)
RDW: 13.2 % (ref 11.0–15.0)
Total Lymphocyte: 17.9 %
WBC: 5.7 10*3/uL (ref 3.8–10.8)

## 2024-02-05 LAB — LIPID PANEL
Cholesterol: 224 mg/dL — ABNORMAL HIGH (ref <200)
HDL: 72 mg/dL (ref >=50)
LDL Cholesterol (Calc): 135 mg/dL — ABNORMAL HIGH
Non-HDL Cholesterol (Calc): 152 mg/dL — ABNORMAL HIGH (ref <130)
Total CHOL/HDL Ratio: 3.1 (calc) (ref <5.0)
Triglycerides: 80 mg/dL (ref <150)

## 2024-02-05 LAB — TSH: TSH: 2.28 m[IU]/L (ref 0.40–4.50)

## 2024-02-06 ENCOUNTER — Ambulatory Visit: Payer: PPO | Admitting: Internal Medicine

## 2024-02-06 ENCOUNTER — Encounter: Payer: Self-pay | Admitting: Internal Medicine

## 2024-02-06 VITALS — BP 122/72 | HR 74 | Temp 98.3°F | Wt 120.0 lb

## 2024-02-06 DIAGNOSIS — Z8739 Personal history of other diseases of the musculoskeletal system and connective tissue: Secondary | ICD-10-CM

## 2024-02-06 DIAGNOSIS — Z1211 Encounter for screening for malignant neoplasm of colon: Secondary | ICD-10-CM | POA: Diagnosis not present

## 2024-02-06 DIAGNOSIS — M8580 Other specified disorders of bone density and structure, unspecified site: Secondary | ICD-10-CM | POA: Diagnosis not present

## 2024-02-06 DIAGNOSIS — Z0000 Encounter for general adult medical examination without abnormal findings: Secondary | ICD-10-CM | POA: Diagnosis not present

## 2024-02-06 DIAGNOSIS — E7800 Pure hypercholesterolemia, unspecified: Secondary | ICD-10-CM | POA: Diagnosis not present

## 2024-02-06 LAB — POC URINALSYSI DIPSTICK (AUTOMATED)
Bilirubin, UA: NEGATIVE
Glucose, UA: NEGATIVE
Ketones, UA: NEGATIVE
Leukocytes, UA: NEGATIVE
Nitrite, UA: NEGATIVE
Protein, UA: NEGATIVE
Spec Grav, UA: >=1.03 — AB (ref 1.010–1.025)
Urobilinogen, UA: NEGATIVE U/dL — AB
pH, UA: 6 (ref 5.0–8.0)

## 2024-02-23 ENCOUNTER — Encounter: Payer: Self-pay | Admitting: Internal Medicine

## 2024-02-23 NOTE — Patient Instructions (Addendum)
 As always, it was a pleasure to see you. Watch fried food and dairy products a bit(Cholesterol).  Let us know if you would like to do coronary calcium scoring. Return in one year or as needed.

## 2024-03-03 ENCOUNTER — Other Ambulatory Visit (HOSPITAL_BASED_OUTPATIENT_CLINIC_OR_DEPARTMENT_OTHER): Payer: Self-pay

## 2024-08-06 ENCOUNTER — Ambulatory Visit (INDEPENDENT_AMBULATORY_CARE_PROVIDER_SITE_OTHER)

## 2024-08-06 ENCOUNTER — Ambulatory Visit: Admitting: Podiatry

## 2024-08-06 ENCOUNTER — Other Ambulatory Visit (HOSPITAL_BASED_OUTPATIENT_CLINIC_OR_DEPARTMENT_OTHER): Payer: Self-pay

## 2024-08-06 DIAGNOSIS — M722 Plantar fascial fibromatosis: Secondary | ICD-10-CM

## 2024-08-06 MED ORDER — MELOXICAM 15 MG PO TABS
15.0000 mg | ORAL_TABLET | Freq: Every day | ORAL | 3 refills | Status: AC
  Filled 2024-08-06: qty 30, 30d supply, fill #0

## 2024-08-06 MED ORDER — TRIAMCINOLONE ACETONIDE 40 MG/ML IJ SUSP
20.0000 mg | Freq: Once | INTRAMUSCULAR | Status: AC
  Administered 2024-08-06: 20 mg

## 2024-08-06 NOTE — Progress Notes (Signed)
  Subjective:  Patient ID: Regina Merritt, female    DOB: [birth year],  MRN: [id number] HPI Chief Complaint  Patient presents with   Plantar Fasciitis    Left foot. Pain is primarily in heel.     73 y.o. female presents with the above complaint.   ROS: Denies fever chills nausea mobic  muscle aches pains calf pain back pain chest pain shortness of breath.  No past medical history on file. Past Surgical History:  Procedure Laterality Date   caterac      Current Outpatient Medications:    meloxicam  (MOBIC ) 15 MG tablet, Take 1 tablet (15 mg total) by mouth daily., Disp: 30 tablet, Rfl: 3  Allergies  Allergen Reactions   Penicillins Dermatitis   Review of Systems Objective:  There were no vitals filed for this visit.  General: Well developed, nourished, in no acute distress, alert and oriented x3   Dermatological: Skin is warm, dry and supple bilateral. Nails x 10 are well maintained; remaining integument appears unremarkable at this time. There are no open sores, no preulcerative lesions, no rash or signs of infection present.  Vascular: Dorsalis Pedis artery and Posterior Tibial artery pedal pulses are 2/4 bilateral with immedate capillary fill time. Pedal hair growth present. No varicosities and no lower extremity edema present bilateral.   Neruologic: Grossly intact via light touch bilateral. Vibratory intact via tuning fork bilateral. Protective threshold with Semmes Wienstein monofilament intact to all pedal sites bilateral. Patellar and Achilles deep tendon reflexes 2+ bilateral. No Babinski or clonus noted bilateral.   Musculoskeletal: No gross boney pedal deformities bilateral. No pain, crepitus, or limitation noted with foot and ankle range of motion bilateral. Muscular strength 5/5 in all groups tested bilateral.  She has pain on palpation medial calcaneal tubercle some tenderness on the lateral aspect of the heel and lateral fifth ray.  She has no pain on medial-lateral  compression of the calcaneus.  Gait: Unassisted, Nonantalgic.    Radiographs:  Radiographs taken today demonstrate osseously mature individual with mild osteopenia.  Plantar distally oriented calcaneal heel spur with soft tissue increase in density at the plantar fascial calcaneal insertion site.  This is consistent with plantar fasciitis.  Assessment & Plan:   Assessment: Plantar fasciitis left.  Plan: Discussed etiology pathology conservative versus surgical therapies.  Started her on meloxicam .  She did not want to take prednisone .  I injected her left heel today 20 mg Kenalog  5 mg Marcaine to the point of maximal tenderness.  She tolerated procedure well without complications.  Was given by: Number instructions for the care of this foot she currently has new [employer].  I will follow-up with her in 1 month if necessary.     Najwa Spillane T. [place], [place]

## 2024-08-07 ENCOUNTER — Other Ambulatory Visit (HOSPITAL_BASED_OUTPATIENT_CLINIC_OR_DEPARTMENT_OTHER): Payer: Self-pay

## 2024-08-24 DIAGNOSIS — H43813 Vitreous degeneration, bilateral: Secondary | ICD-10-CM | POA: Diagnosis not present

## 2024-08-24 DIAGNOSIS — H35372 Puckering of macula, left eye: Secondary | ICD-10-CM | POA: Diagnosis not present

## 2024-08-24 DIAGNOSIS — Z961 Presence of intraocular lens: Secondary | ICD-10-CM | POA: Diagnosis not present

## 2024-08-31 ENCOUNTER — Encounter: Payer: Self-pay | Admitting: Internal Medicine

## 2024-08-31 ENCOUNTER — Ambulatory Visit (INDEPENDENT_AMBULATORY_CARE_PROVIDER_SITE_OTHER): Admitting: Internal Medicine

## 2024-08-31 ENCOUNTER — Other Ambulatory Visit (HOSPITAL_BASED_OUTPATIENT_CLINIC_OR_DEPARTMENT_OTHER): Payer: Self-pay

## 2024-08-31 VITALS — BP 130/70 | HR 86 | Ht 67.0 in | Wt 117.0 lb

## 2024-08-31 DIAGNOSIS — J3489 Other specified disorders of nose and nasal sinuses: Secondary | ICD-10-CM

## 2024-08-31 DIAGNOSIS — Z8739 Personal history of other diseases of the musculoskeletal system and connective tissue: Secondary | ICD-10-CM | POA: Diagnosis not present

## 2024-08-31 DIAGNOSIS — J029 Acute pharyngitis, unspecified: Secondary | ICD-10-CM

## 2024-08-31 LAB — POC COVID19/FLU A&B COMBO
Covid Antigen, POC: NEGATIVE
Influenza A Antigen, POC: NEGATIVE
Influenza B Antigen, POC: NEGATIVE

## 2024-08-31 LAB — POCT RAPID STREP A (OFFICE): Rapid Strep A Screen: NEGATIVE

## 2024-08-31 LAB — POCT RESPIRATORY SYNCYTIAL VIRUS: RSV Rapid Ag: NEGATIVE

## 2024-08-31 MED ORDER — HYDROCODONE BIT-HOMATROP MBR 5-1.5 MG/5ML PO SOLN
5.0000 mL | Freq: Three times a day (TID) | ORAL | 0 refills | Status: AC | PRN
  Filled 2024-08-31: qty 120, 8d supply, fill #0

## 2024-08-31 MED ORDER — AZITHROMYCIN 250 MG PO TABS
ORAL_TABLET | ORAL | 0 refills | Status: AC
  Filled 2024-08-31: qty 6, 5d supply, fill #0

## 2024-08-31 MED ORDER — FLUCONAZOLE 150 MG PO TABS
150.0000 mg | ORAL_TABLET | Freq: Once | ORAL | 0 refills | Status: AC
Start: 2024-08-31 — End: 2024-09-01
  Filled 2024-08-31: qty 1, 1d supply, fill #0

## 2024-08-31 NOTE — Progress Notes (Addendum)
 Patient Care Team: Perri Ronal PARAS, MD as PCP - General (Internal Medicine)  Visit Date: 08/31/24  Subjective:    Patient ID: Regina Merritt , Female   DOB: September 29, 1951, 73 y.o.    MRN: [id number]   73 y.o. Female presents today for sick visit for sore throat. Patient has a past medical history of osteopenia, Rheumatoid arthritis, Migraine headaches.  She said that she began to feel ill on Thursday. She has had fever, congestion, sore throat, headache and a productive cough. She denies having any diarrhea, nausea or vomiting. Covid-19, Influenza,  Rapid Strep and RSV tests were negative. She said that no one else in her house is ill and she has not been traveling recently.   She has a hx of Rheumatoid Arthritis but is not on DMARD, Her general health is quite good.Recently seen by Dr. Verta, Podiatrist and dx with plantar fasciitis.    Family History  Problem Relation Age of Onset   Heart failure Father        Deceased   Breast cancer Neg Hx     Social History   Social History Narrative   Married twice. 2 children. Non-smoker.  No alcohol consumption. Retired - previously worked as a Technical brewer at [employer].   Fhx:    Father w/ hx of Hypertension.   1 Brother      Review of Systems  Constitutional:  Positive for fever.  HENT:  Positive for congestion and sore throat.   Respiratory:  Positive for cough and sputum production.   Gastrointestinal:  Negative for diarrhea, nausea and vomiting.  Neurological:  Positive for headaches.        Objective:   Vitals: BP 130/70   Pulse 86   Ht 5' 7 (1.702 m)   Wt 117 lb (53.1 kg)   PF 97 L/min   BMI 18.32 kg/m    Physical Exam Vitals and nursing note reviewed.  Constitutional:      General: She is not in acute distress.    Appearance: Normal appearance. She is not ill-appearing.  HENT:     Head: Normocephalic and atraumatic.     Right Ear: Tympanic membrane, ear canal and external ear normal.     Left Ear:  Tympanic membrane, ear canal and external ear normal.     Mouth/Throat:     Mouth: Mucous membranes are moist.     Pharynx: Oropharynx is clear. No oropharyngeal exudate or posterior oropharyngeal erythema.     Comments: Pharynx injected without exudate.  Pulmonary:     Effort: Pulmonary effort is normal.     Breath sounds: Normal breath sounds. No wheezing, rhonchi or rales.  Lymphadenopathy:     Cervical: No cervical adenopathy.  Skin:    General: Skin is warm and dry.  Neurological:     Mental Status: She is alert and oriented to person, place, and time. Mental status is at baseline.  Psychiatric:        Mood and Affect: Mood normal.        Behavior: Behavior normal.        Thought Content: Thought content normal.        Judgment: Judgment normal.       Results:      Labs:       Component Value Date/Time   NA 139 02/04/2024 0939   K 4.6 02/04/2024 0939   CL 104 02/04/2024 0939   CO2 30 02/04/2024 0939   GLUCOSE 86  02/04/2024 0939   BUN 19 02/04/2024 0939   CREATININE 0.96 02/04/2024 0939   CALCIUM 9.4 02/04/2024 0939   PROT 6.7 02/04/2024 0939   ALBUMIN 4.1 09/13/2014 1011   AST 15 02/04/2024 0939   ALT 11 02/04/2024 0939   ALKPHOS 50 09/13/2014 1011   BILITOT 0.5 02/04/2024 0939   GFRNONAA 55 (L) 10/26/2020 1115   GFRAA 64 10/26/2020 1115     Lab Results  Component Value Date   WBC 5.7 02/04/2024   HGB 13.3 02/04/2024   HCT 41.0 02/04/2024   MCV 89.1 02/04/2024   PLT 241 02/04/2024    Lab Results  Component Value Date   CHOL 224 (H) 02/04/2024   HDL 72 02/04/2024   LDLCALC 135 (H) 02/04/2024   TRIG 80 02/04/2024   CHOLHDL 3.1 02/04/2024          Assessment & Plan:   Acute pharyngitis- Rapid strep screen is negative, RSV is negative, Covid and Influenza rapid tests are both negative.This is likely viral etiology (tested Negative for Covid). Does not feel well and symptoms have persisted for several days.I feel she would benefit from a  course of antibiotics at this time.  Hx of Rheumatoid arthritis. Not on DMARD and stable  Plan: Patient and I discussed her symptoms and have agreed that we will treat her for acute pharyngitis with Zithromax  Z-PAK 2 tabs day 1 followed by 1 tab days 2 through 5.  She may take Diflucan 150 mg tablet one-time dose if she gets Candida vaginitis while on antibiotics.  Also for sore throat pain/potential cough she may take Hycodan 1 teaspoon every 8 hours as needed.  Rest and stay well-hydrated.  I,Makayla C Reid,acting as a scribe for Ronal JINNY Hailstone, MD.,have documented all relevant documentation on the behalf of Ronal JINNY Hailstone, MD,as directed by  Ronal JINNY Hailstone, MD while in the presence of Ronal JINNY Hailstone, MD.   I, Ronal JINNY Hailstone, MD, have reviewed all documentation for this visit. The documentation on 08/31/2024 for the exam, diagnosis, procedures, and orders are all accurate and complete.

## 2024-08-31 NOTE — Patient Instructions (Addendum)
 We are sorry you are not feeling well. Multiple test were performed to rule out several causes of acute sore throat. All of these tests were negative including RSV, Covid, Influenza A, abd Group A strep. It is likely some other viral etiology. Rest and stay well hydrated. Take Zithromax  as directed.call if not better in 5 - 7 days or sooner if worse.

## 2024-09-03 ENCOUNTER — Ambulatory Visit: Admitting: Podiatry

## 2024-09-04 ENCOUNTER — Other Ambulatory Visit: Payer: Self-pay | Admitting: Internal Medicine

## 2024-09-04 ENCOUNTER — Other Ambulatory Visit (HOSPITAL_BASED_OUTPATIENT_CLINIC_OR_DEPARTMENT_OTHER): Payer: Self-pay

## 2024-09-04 ENCOUNTER — Encounter (HOSPITAL_BASED_OUTPATIENT_CLINIC_OR_DEPARTMENT_OTHER): Payer: Self-pay

## 2024-09-07 ENCOUNTER — Telehealth: Payer: Self-pay | Admitting: Internal Medicine

## 2024-11-03 ENCOUNTER — Other Ambulatory Visit: Payer: Self-pay

## 2024-11-03 ENCOUNTER — Other Ambulatory Visit (HOSPITAL_BASED_OUTPATIENT_CLINIC_OR_DEPARTMENT_OTHER): Payer: Self-pay

## 2024-11-03 ENCOUNTER — Other Ambulatory Visit (HOSPITAL_COMMUNITY): Payer: Self-pay

## 2024-11-05 NOTE — Telephone Encounter (Signed)
 Done
